# Patient Record
Sex: Female | Born: 1965
Health system: Southern US, Community
[De-identification: ages and names within clinical notes are randomized; demographics above are authoritative.]

## PROBLEM LIST (undated history)

## (undated) DIAGNOSIS — E282 Polycystic ovarian syndrome: Secondary | ICD-10-CM

## (undated) DIAGNOSIS — D509 Iron deficiency anemia, unspecified: Secondary | ICD-10-CM

## (undated) DIAGNOSIS — J45909 Unspecified asthma, uncomplicated: Secondary | ICD-10-CM

## (undated) DIAGNOSIS — D72829 Elevated white blood cell count, unspecified: Secondary | ICD-10-CM

## (undated) DIAGNOSIS — N6320 Unspecified lump in the left breast, unspecified quadrant: Secondary | ICD-10-CM

## (undated) HISTORY — PX: HERNIA REPAIR: SHX51

## (undated) HISTORY — PX: ABDOMINAL HYSTERECTOMY: SHX81

## (undated) HISTORY — DX: Polycystic ovarian syndrome: E28.2

## (undated) HISTORY — DX: Unspecified lump in the left breast, unspecified quadrant: N63.20

## (undated) HISTORY — DX: Elevated white blood cell count, unspecified: D72.829

## (undated) HISTORY — PX: APPENDECTOMY: SHX54

## (undated) HISTORY — PX: CHOLECYSTECTOMY: SHX55

## (undated) HISTORY — PX: BACK SURGERY: SHX140

## (undated) HISTORY — DX: Iron deficiency anemia, unspecified: D50.9

---

## 2011-02-09 ENCOUNTER — Encounter: Payer: Self-pay | Admitting: Family Medicine

## 2011-02-09 LAB — CONVERTED CEMR LAB
ALT: 10 U/L
AST: 12 U/L
Albumin: 4.1 g/dL
Alkaline Phosphatase: 101 U/L
BUN: 8 mg/dL
Basophils Absolute: 0 K/uL
Basophils Relative: 0 %
CO2: 24 meq/L
Calcium: 9.3 mg/dL
Chloride: 104 meq/L
Cholesterol: 222 mg/dL — ABNORMAL HIGH
Creatinine, Ser: 0.6 mg/dL
Eosinophils Absolute: 0.6 K/uL
Eosinophils Relative: 6 % — ABNORMAL HIGH
Glucose, Bld: 93 mg/dL
HCT: 36.7 %
HDL: 39 mg/dL — ABNORMAL LOW
Hemoglobin: 11.4 g/dL — ABNORMAL LOW
LDL Cholesterol: 149 mg/dL — ABNORMAL HIGH
Lymphocytes Relative: 34 %
Lymphs Abs: 3.4 K/uL
MCHC: 31.1 g/dL
MCV: 82.7 fL
Monocytes Absolute: 0.7 K/uL
Monocytes Relative: 7 %
Neutro Abs: 5.4 K/uL
Neutrophils Relative %: 53 %
Platelets: 578 K/uL — ABNORMAL HIGH
Potassium: 4.3 meq/L
RBC: 4.44 M/uL
RDW: 15.1 %
Sodium: 140 meq/L
TSH: 0.87 u[IU]/mL
Total Bilirubin: 0.3 mg/dL
Total CHOL/HDL Ratio: 5.7
Total Protein: 7.1 g/dL
Triglycerides: 169 mg/dL — ABNORMAL HIGH
VLDL: 34 mg/dL
Vit D, 25-Hydroxy: 17 ng/mL — ABNORMAL LOW
Vitamin B-12: >2000 pg/mL — ABNORMAL HIGH
WBC: 10.2 10*3/microliter

## 2011-08-11 ENCOUNTER — Other Ambulatory Visit: Payer: Self-pay | Admitting: Family Medicine

## 2011-08-11 ENCOUNTER — Encounter: Payer: Self-pay | Admitting: Family Medicine

## 2011-08-11 ENCOUNTER — Other Ambulatory Visit: Payer: Self-pay

## 2011-08-11 ENCOUNTER — Ambulatory Visit (HOSPITAL_COMMUNITY)
Admission: RE | Admit: 2011-08-11 | Discharge: 2011-08-11 | Disposition: A | Discharge status: Home / Self Care | Payer: 59 | Source: Ambulatory Visit | Attending: Family Medicine | Admitting: Family Medicine

## 2011-08-11 DIAGNOSIS — J309 Allergic rhinitis, unspecified: Secondary | ICD-10-CM | POA: Insufficient documentation

## 2011-08-11 DIAGNOSIS — R079 Chest pain, unspecified: Secondary | ICD-10-CM | POA: Insufficient documentation

## 2011-08-11 DIAGNOSIS — I499 Cardiac arrhythmia, unspecified: Secondary | ICD-10-CM | POA: Insufficient documentation

## 2011-08-11 DIAGNOSIS — J45909 Unspecified asthma, uncomplicated: Secondary | ICD-10-CM | POA: Insufficient documentation

## 2011-08-11 MED ORDER — DULOXETINE HCL 30 MG PO CPEP: 30.0000 mg | ORAL_CAPSULE | Freq: Every day | ORAL | Status: DC

## 2011-08-11 MED ORDER — PRAVASTATIN SODIUM 20 MG PO TABS: 20.0000 mg | ORAL_TABLET | Freq: Every evening | ORAL | Status: DC

## 2011-08-11 MED ORDER — MONTELUKAST SODIUM 10 MG PO TABS: 10.0000 mg | ORAL_TABLET | Freq: Every day | ORAL | Status: DC

## 2011-08-11 NOTE — Progress Notes (Signed)
  Subjective:    Patient ID: Tammy White, female    DOB: [birth year], 45 y.o.   MRN: [id number]  HPI  CHEST PAIN: several episodes in last few weeks--not related to exertion or emotional stress. 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 Can last minutes to hour. Last night had one that was worse, 7/10 pain, right side of her chest radiating to her right arm and jaw. Lasted more than an hour. No nausea or diaphoresis with it.  Having increased number of palpitations--these not necessarily associated with chest pain.  PERINENT FH / SH: Father died of his initial MI at 12 and her sister had MI at 71 and now has three stents. Nonsmoker. Exercises some, obesity long term issue for her. No alcohol or illicits  PAST SURGICAL HX: Back surgery x 2 Appendectomy Breast reduction' Hysterectomy c section x 2 Cholecystectomy Ventral hernia x 2  Review of Systems Complete ros negative except for pertinents noted in HPI.    Objective:   Physical Exam  Constitutional: She is oriented to person, place, and time.  Neck: Normal range of motion. Neck supple. No thyromegaly present.  Cardiovascular: Normal rate, regular rhythm, normal heart sounds and intact distal pulses.   Pulmonary/Chest: Breath sounds normal.  Abdominal: Soft. Bowel sounds are normal.  Musculoskeletal: She exhibits no edema.  Neurological: She is alert and oriented to person, place, and time.  GEN WD WN NAD  EKG: NSR rate 90, short PR no delta wave identified (PR= 102), QTc 462. No ST changes, no old EKg to compare with       Assessment & Plan:  Chest pain with strong FH CV disease Cards referral Check FLP, CMP and CBC

## 2011-08-19 ENCOUNTER — Other Ambulatory Visit: Payer: Self-pay | Admitting: Family Medicine

## 2011-08-19 LAB — COMPREHENSIVE METABOLIC PANEL
ALT: 14 U/L (ref 0–35)
AST: 17 U/L (ref 0–37)
Albumin: 4.2 g/dL (ref 3.5–5.2)
CO2: 25 mEq/L (ref 19–32)
Calcium: 9.3 mg/dL (ref 8.4–10.5)
Chloride: 102 mEq/L (ref 96–112)
Creat: 0.61 mg/dL (ref 0.50–1.10)
Potassium: 4.3 mEq/L (ref 3.5–5.3)
Sodium: 138 mEq/L (ref 135–145)
Total Protein: 7 g/dL (ref 6.0–8.3)

## 2011-08-19 LAB — TSH: TSH: 1.953 u[IU]/mL (ref 0.350–4.500)

## 2011-08-19 LAB — LIPID PANEL: Total CHOL/HDL Ratio: 4.4 Ratio

## 2011-08-19 LAB — FOLLICLE STIMULATING HORMONE: FSH: 4.4 m[IU]/mL

## 2011-08-20 LAB — CBC WITH DIFFERENTIAL/PLATELET
Basophils Absolute: 0 10*3/uL (ref 0.0–0.1)
Lymphocytes Relative: 37 % (ref 12–46)
Lymphs Abs: 3.8 10*3/uL (ref 0.7–4.0)
Neutro Abs: 5.2 10*3/uL (ref 1.7–7.7)
Platelets: 562 10*3/uL — ABNORMAL HIGH (ref 150–400)
RBC: 4.37 MIL/uL (ref 3.87–5.11)
RDW: 15.3 % (ref 11.5–15.5)
WBC: 10.3 10*3/uL (ref 4.0–10.5)

## 2011-08-25 ENCOUNTER — Encounter: Payer: Self-pay | Admitting: Family Medicine

## 2011-09-20 ENCOUNTER — Ambulatory Visit: Payer: Self-pay | Admitting: Cardiovascular Disease

## 2011-09-20 ENCOUNTER — Ambulatory Visit (INDEPENDENT_AMBULATORY_CARE_PROVIDER_SITE_OTHER): Payer: 59 | Admitting: *Deleted

## 2011-09-20 ENCOUNTER — Encounter: Payer: Self-pay | Admitting: Cardiovascular Disease

## 2011-09-20 DIAGNOSIS — Z111 Encounter for screening for respiratory tuberculosis: Secondary | ICD-10-CM

## 2011-09-20 NOTE — Progress Notes (Signed)
PPD applied right forearm, 0.1 cc intradermal.   [employer] lot # [id number] , exp date 05/29/2013.

## 2011-09-22 ENCOUNTER — Ambulatory Visit (INDEPENDENT_AMBULATORY_CARE_PROVIDER_SITE_OTHER): Payer: 59 | Admitting: *Deleted

## 2011-09-22 DIAGNOSIS — IMO0001 Reserved for inherently not codable concepts without codable children: Secondary | ICD-10-CM

## 2011-09-22 DIAGNOSIS — Z111 Encounter for screening for respiratory tuberculosis: Secondary | ICD-10-CM

## 2011-09-22 NOTE — Progress Notes (Signed)
PPD negative-0 mm. 

## 2011-09-24 ENCOUNTER — Encounter: Payer: Self-pay | Admitting: Cardiovascular Disease

## 2011-12-28 DIAGNOSIS — N6320 Unspecified lump in the left breast, unspecified quadrant: Secondary | ICD-10-CM

## 2011-12-28 HISTORY — DX: Unspecified lump in the left breast, unspecified quadrant: N63.20

## 2012-01-31 ENCOUNTER — Encounter: Payer: Self-pay | Admitting: Family Medicine

## 2012-02-01 ENCOUNTER — Encounter: Payer: Self-pay | Admitting: Family Medicine

## 2012-03-23 ENCOUNTER — Telehealth: Payer: Self-pay | Admitting: Family Medicine

## 2012-03-23 NOTE — Telephone Encounter (Signed)
error 

## 2012-03-29 ENCOUNTER — Ambulatory Visit (INDEPENDENT_AMBULATORY_CARE_PROVIDER_SITE_OTHER): Payer: 59 | Admitting: Family Medicine

## 2012-03-29 DIAGNOSIS — R1032 Left lower quadrant pain: Secondary | ICD-10-CM

## 2012-03-30 ENCOUNTER — Ambulatory Visit
Admission: RE | Admit: 2012-03-30 | Discharge: 2012-03-30 | Disposition: A | Discharge status: Home / Self Care | Payer: 59 | Source: Ambulatory Visit | Attending: Family Medicine | Admitting: Family Medicine

## 2012-03-30 DIAGNOSIS — R1032 Left lower quadrant pain: Secondary | ICD-10-CM

## 2012-03-30 MED ORDER — IOHEXOL 300 MG/ML  SOLN
100.0000 mL | Freq: Once | INTRAMUSCULAR | Status: AC | PRN
  Administered 2012-03-30: 100 mL via INTRAVENOUS

## 2012-03-30 NOTE — Progress Notes (Signed)
Patient ID: Tammy White, female   DOB: September 12, 1966, 46 y.o.   MRN: [id number] Patient complained of abdomen feeling hard to the touch. She said intermittent sharp abdominal pains. History of multiple bowel surgeries. She's worried that she is developing another hernia. On exam she did have a firm lower left quadrant there was without rebound but was tender to palpation. We'll get CT scan.

## 2012-04-13 ENCOUNTER — Other Ambulatory Visit: Payer: Self-pay | Admitting: Family Medicine

## 2012-04-13 MED ORDER — FLUTICASONE PROPIONATE 50 MCG/ACT NA SUSP: 2.0000 | Freq: Every day | NASAL | Status: DC

## 2012-04-14 ENCOUNTER — Other Ambulatory Visit: Payer: Self-pay | Admitting: Family Medicine

## 2012-04-14 MED ORDER — FLUTICASONE PROPIONATE 50 MCG/ACT NA SUSP: 2.0000 | Freq: Every day | NASAL | Status: DC

## 2012-04-19 ENCOUNTER — Other Ambulatory Visit: Payer: Self-pay | Admitting: Family Medicine

## 2012-04-19 MED ORDER — FLUOXETINE HCL 20 MG PO TABS: 20.0000 mg | ORAL_TABLET | Freq: Every day | ORAL | Status: DC

## 2012-04-19 MED ORDER — BUPROPION HCL ER (XL) 150 MG PO TB24: 150.0000 mg | ORAL_TABLET | Freq: Every day | ORAL | Status: DC

## 2012-05-01 ENCOUNTER — Other Ambulatory Visit: Payer: Self-pay | Admitting: Family Medicine

## 2012-05-01 DIAGNOSIS — E871 Hypo-osmolality and hyponatremia: Secondary | ICD-10-CM

## 2012-05-01 LAB — COMPREHENSIVE METABOLIC PANEL
Albumin: 3.8 g/dL (ref 3.5–5.2)
BUN: 11 mg/dL (ref 6–23)
CO2: 25 mEq/L (ref 19–32)
Calcium: 9.5 mg/dL (ref 8.4–10.5)
Chloride: 100 mEq/L (ref 96–112)
Glucose, Bld: 85 mg/dL (ref 70–99)
Potassium: 4.1 mEq/L (ref 3.5–5.3)
Sodium: 139 mEq/L (ref 135–145)
Total Protein: 7.3 g/dL (ref 6.0–8.3)

## 2012-05-01 NOTE — Progress Notes (Signed)
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 As I recently switched her from cymbalta to SSRI and welbutrin, I willorder lab this am.

## 2012-05-18 NOTE — Progress Notes (Signed)
Addended by: Deno Etienne on: 05/18/2012 05:27 PM   Modules accepted: Orders

## 2012-06-20 ENCOUNTER — Ambulatory Visit (HOSPITAL_COMMUNITY)
Admission: RE | Admit: 2012-06-20 | Discharge: 2012-06-20 | Disposition: A | Discharge status: Home / Self Care | Payer: 59 | Source: Ambulatory Visit | Attending: Family Medicine | Admitting: Family Medicine

## 2012-06-20 ENCOUNTER — Other Ambulatory Visit: Payer: Self-pay | Admitting: Family Medicine

## 2012-06-20 DIAGNOSIS — M79644 Pain in right finger(s): Secondary | ICD-10-CM | POA: Insufficient documentation

## 2012-06-20 DIAGNOSIS — M899 Disorder of bone, unspecified: Secondary | ICD-10-CM | POA: Insufficient documentation

## 2012-06-20 DIAGNOSIS — M79609 Pain in unspecified limb: Secondary | ICD-10-CM | POA: Insufficient documentation

## 2012-06-20 NOTE — Assessment & Plan Note (Signed)
Skin lesion on right middle finger for 3 months.  Has become exquisitely painful.  Will check x ray to R/O osteo.

## 2012-09-27 ENCOUNTER — Ambulatory Visit (INDEPENDENT_AMBULATORY_CARE_PROVIDER_SITE_OTHER): Payer: 59 | Admitting: *Deleted

## 2012-09-27 DIAGNOSIS — Z23 Encounter for immunization: Secondary | ICD-10-CM

## 2012-10-09 ENCOUNTER — Other Ambulatory Visit: Payer: Self-pay | Admitting: Family Medicine

## 2012-10-09 MED ORDER — PREDNISONE 10 MG PO TABS: ORAL_TABLET | ORAL | Status: DC

## 2012-10-20 ENCOUNTER — Ambulatory Visit (INDEPENDENT_AMBULATORY_CARE_PROVIDER_SITE_OTHER): Payer: Managed Care, Other (non HMO) | Admitting: Family Medicine

## 2012-10-20 DIAGNOSIS — J45901 Unspecified asthma with (acute) exacerbation: Secondary | ICD-10-CM

## 2012-10-20 MED ORDER — METHYLPREDNISOLONE SODIUM SUCC 125 MG IJ SOLR
125.0000 mg | Freq: Once | INTRAMUSCULAR | Status: AC
  Administered 2012-10-20: 125 mg via INTRAMUSCULAR

## 2012-10-20 MED ORDER — ALBUTEROL SULFATE (2.5 MG/3ML) 0.083% IN NEBU
2.5000 mg | INHALATION_SOLUTION | Freq: Once | RESPIRATORY_TRACT | Status: AC
  Administered 2012-10-20: 2.5 mg via RESPIRATORY_TRACT

## 2012-10-20 MED ORDER — IPRATROPIUM BROMIDE 0.02 % IN SOLN
0.5000 mg | Freq: Once | RESPIRATORY_TRACT | Status: AC
  Administered 2012-10-20: 0.5 mg via RESPIRATORY_TRACT

## 2012-10-20 NOTE — Assessment & Plan Note (Addendum)
Asthma exacerbation.  Duoneb given x1 with good response, good air movement and decreased wheezes after treatment.  Given dose of IM solumedrol as well.  No indications of abx at this time.  Instructed to f/u with pcp if continues to worsen.

## 2012-10-26 NOTE — Progress Notes (Signed)
  Subjective:    Patient ID: Tammy White, female    DOB: [birth year], 46 y.o.   MRN: [id number]  HPI  1. Asthma:  Complaint of worsening asthma over the past couple of days.  Has symptoms of URI including congestion, cough.  She has recently completed course of steroids rx'ed by PCP with initial improvement but now worsened.  She does endorse wheezing with shortness of breath, and has been using her albuterol fairly frequently.  Denies fever, chills, chest pain.  Review of Systems Per HPI    Objective:   Physical Exam  Constitutional: No distress.  HENT:  Head: Normocephalic and atraumatic.  Cardiovascular: Normal rate and regular rhythm.   Pulmonary/Chest:       Decreased air movement throughout all lung fields with some mild expiratory wheezing.            Assessment & Plan:

## 2012-12-25 ENCOUNTER — Other Ambulatory Visit: Payer: Self-pay | Admitting: Family Medicine

## 2012-12-25 MED ORDER — PRAVASTATIN SODIUM 20 MG PO TABS: 20.0000 mg | ORAL_TABLET | Freq: Every day | ORAL | Status: DC

## 2013-02-02 ENCOUNTER — Other Ambulatory Visit: Payer: Self-pay | Admitting: *Deleted

## 2013-02-02 DIAGNOSIS — F419 Anxiety disorder, unspecified: Secondary | ICD-10-CM

## 2013-02-02 MED ORDER — CITALOPRAM HYDROBROMIDE 20 MG PO TABS: 20.0000 mg | ORAL_TABLET | Freq: Every day | ORAL | Status: DC

## 2013-04-06 ENCOUNTER — Other Ambulatory Visit: Payer: Self-pay | Admitting: *Deleted

## 2013-04-06 DIAGNOSIS — J45909 Unspecified asthma, uncomplicated: Secondary | ICD-10-CM

## 2013-04-06 MED ORDER — DEXAMETHASONE SOD PHOSPHATE PF 10 MG/ML IJ SOLN
10.0000 mg | Freq: Once | INTRAMUSCULAR | Status: AC
  Administered 2013-04-06: 10 mg via INTRAMUSCULAR

## 2013-05-03 ENCOUNTER — Other Ambulatory Visit: Payer: Self-pay | Admitting: Family Medicine

## 2013-05-03 ENCOUNTER — Ambulatory Visit (INDEPENDENT_AMBULATORY_CARE_PROVIDER_SITE_OTHER): Payer: 59 | Admitting: Family Medicine

## 2013-05-03 VITALS — BP 139/76 | HR 90 | Temp 97.9°F | Ht 61.0 in | Wt 175.0 lb

## 2013-05-03 DIAGNOSIS — D6859 Other primary thrombophilia: Secondary | ICD-10-CM | POA: Insufficient documentation

## 2013-05-03 DIAGNOSIS — J45909 Unspecified asthma, uncomplicated: Secondary | ICD-10-CM

## 2013-05-03 DIAGNOSIS — Z0000 Encounter for general adult medical examination without abnormal findings: Secondary | ICD-10-CM

## 2013-05-03 DIAGNOSIS — E785 Hyperlipidemia, unspecified: Secondary | ICD-10-CM | POA: Insufficient documentation

## 2013-05-03 DIAGNOSIS — Z01419 Encounter for gynecological examination (general) (routine) without abnormal findings: Secondary | ICD-10-CM | POA: Insufficient documentation

## 2013-05-03 MED ORDER — CITALOPRAM HYDROBROMIDE 40 MG PO TABS: 40.0000 mg | ORAL_TABLET | Freq: Every day | ORAL | Status: DC

## 2013-05-03 MED ORDER — ATORVASTATIN CALCIUM 20 MG PO TABS: 20.0000 mg | ORAL_TABLET | Freq: Every day | ORAL | Status: AC

## 2013-05-03 NOTE — Progress Notes (Signed)
  Subjective:    Patient ID: Tammy White, female    DOB: Oct 02, 1966, 47 y.o.   MRN: [id number]  HPI  1. Asthma: using her albuterol "like candy". Feels tight in chest most days. Does have hx of allergy esp to grass being a problem.  2. Concerned about weight: walking for exercise several days a week and trying to eat well. Highest weight in her life was 210. Has had breast reduction surgery in past.  3. Mood---continues on celexa which she has been on for several years---does not feel like her low grade depression ever really lifts but is functional,no SI/HI.   4. Would like to get some basic labs and recheckplateltes which have been high for several years.  Review of Systems  Constitutional: Negative for fever and unexpected weight change.  HENT: Positive for rhinorrhea, sneezing and postnasal drip.   Eyes: Positive for itching. Negative for discharge, redness and visual disturbance.  Respiratory: Positive for chest tightness, shortness of breath and wheezing.   Cardiovascular: Negative for palpitations.  Gastrointestinal: Negative for abdominal pain, diarrhea and constipation.  Genitourinary: Negative for frequency and difficulty urinating.  Skin: Negative for rash.  Neurological: Positive for headaches. Negative for speech difficulty.  Hematological: Does not bruise/bleed easily.  Psychiatric/Behavioral: Positive for dysphoric mood. Negative for suicidal ideas, hallucinations, behavioral problems, confusion, sleep disturbance, self-injury and agitation. The patient is not nervous/anxious.        Objective:   Physical Exam  Constitutional: She is oriented to person, place, and time. She appears well-developed.  HENT:  Head: Normocephalic and atraumatic.  Mouth/Throat: Oropharynx is clear and moist.  Eyes: Conjunctivae and EOM are normal. Pupils are equal, round, and reactive to light.  Neck: Normal range of motion. Neck supple.  Cardiovascular: Normal rate, regular rhythm and  normal heart sounds.   No murmur heard. Pulmonary/Chest: Effort normal. She has wheezes. She has no rales.  Abdominal: Soft. Bowel sounds are normal.  Musculoskeletal: Normal range of motion.  Neurological: She is alert and oriented to person, place, and time. She has normal reflexes.  Skin: No rash noted.  Psychiatric: She has a normal mood and affect. Her behavior is normal. Judgment and thought content normal.          Assessment & Plan:

## 2013-05-03 NOTE — Assessment & Plan Note (Signed)
Reviewed her Fh which is positive for early ASCVD. Given new guidelines I think she owuld be better served by higher dose statin such as lipitor 20. Will see if she tolerates. Baseline lipid panel today on pravachol

## 2013-05-03 NOTE — Assessment & Plan Note (Signed)
Recheck cbc today.   

## 2013-05-03 NOTE — Assessment & Plan Note (Signed)
Given her hx and her current symptoms, I think she needs to see pulmonology, and perhaps at some point allergist. Has previously been on allergy shots but one of her intubations was after a shot (she thinks). Will refer to pulm and then see if we want her to go to allergy as well.

## 2013-05-04 LAB — CBC WITH DIFFERENTIAL/PLATELET
Basophils Relative: 0 % (ref 0–1)
Eosinophils Absolute: 0.5 10*3/uL (ref 0.0–0.7)
Hemoglobin: 11.3 g/dL — ABNORMAL LOW (ref 12.0–15.0)
Lymphs Abs: 3.6 10*3/uL (ref 0.7–4.0)
MCH: 24.6 pg — ABNORMAL LOW (ref 26.0–34.0)
Monocytes Relative: 8 % (ref 3–12)
Neutro Abs: 6 10*3/uL (ref 1.7–7.7)
Neutrophils Relative %: 55 % (ref 43–77)
Platelets: 616 10*3/uL — ABNORMAL HIGH (ref 150–400)
RBC: 4.6 MIL/uL (ref 3.87–5.11)
WBC: 11 10*3/uL — ABNORMAL HIGH (ref 4.0–10.5)

## 2013-05-04 LAB — COMPREHENSIVE METABOLIC PANEL
ALT: 17 U/L (ref 0–35)
Albumin: 4 g/dL (ref 3.5–5.2)
Alkaline Phosphatase: 99 U/L (ref 39–117)
CO2: 26 mEq/L (ref 19–32)
Glucose, Bld: 108 mg/dL — ABNORMAL HIGH (ref 70–99)
Potassium: 4 mEq/L (ref 3.5–5.3)
Sodium: 136 mEq/L (ref 135–145)
Total Bilirubin: 0.3 mg/dL (ref 0.3–1.2)
Total Protein: 7 g/dL (ref 6.0–8.3)

## 2013-05-04 LAB — LIPID PANEL
LDL Cholesterol: 125 mg/dL — ABNORMAL HIGH (ref 0–99)
VLDL: 37 mg/dL (ref 0–40)

## 2013-05-10 ENCOUNTER — Other Ambulatory Visit: Payer: Self-pay | Admitting: Family Medicine

## 2013-05-10 ENCOUNTER — Encounter: Payer: Self-pay | Admitting: Family Medicine

## 2013-05-10 DIAGNOSIS — D6859 Other primary thrombophilia: Secondary | ICD-10-CM

## 2013-05-15 ENCOUNTER — Encounter: Payer: Self-pay | Admitting: *Deleted

## 2013-05-30 ENCOUNTER — Institutional Professional Consult (permissible substitution): Payer: Self-pay | Admitting: Pulmonary Disease

## 2013-06-05 ENCOUNTER — Institutional Professional Consult (permissible substitution): Payer: Self-pay | Admitting: Pulmonary Disease

## 2013-06-06 ENCOUNTER — Telehealth: Payer: Self-pay | Admitting: *Deleted

## 2013-06-06 MED ORDER — EPINEPHRINE 0.3 MG/0.3ML IJ SOAJ: 0.3000 mg | Freq: Once | INTRAMUSCULAR | Status: DC

## 2013-06-06 NOTE — Telephone Encounter (Signed)
Epi pen requested

## 2013-06-21 IMAGING — CR DG FINGER MIDDLE 2+V*R*
3 series · 3 of 3 positions shown · non-contrast
Comparison: None.

CLINICAL DATA: Finger pain.

RIGHT MIDDLE FINGER 2+V

[x finger pa right]
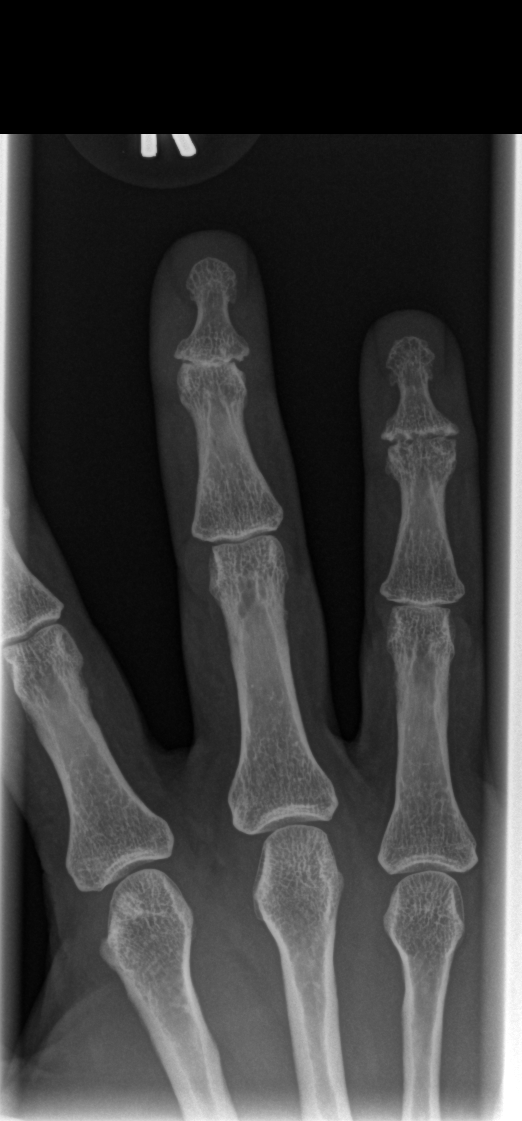

[x finger obl. right]
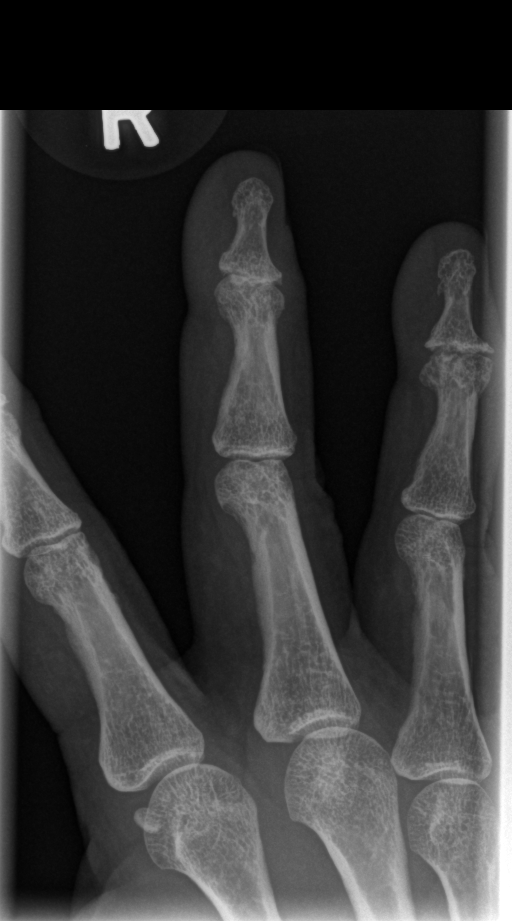

[x finger lateral right]
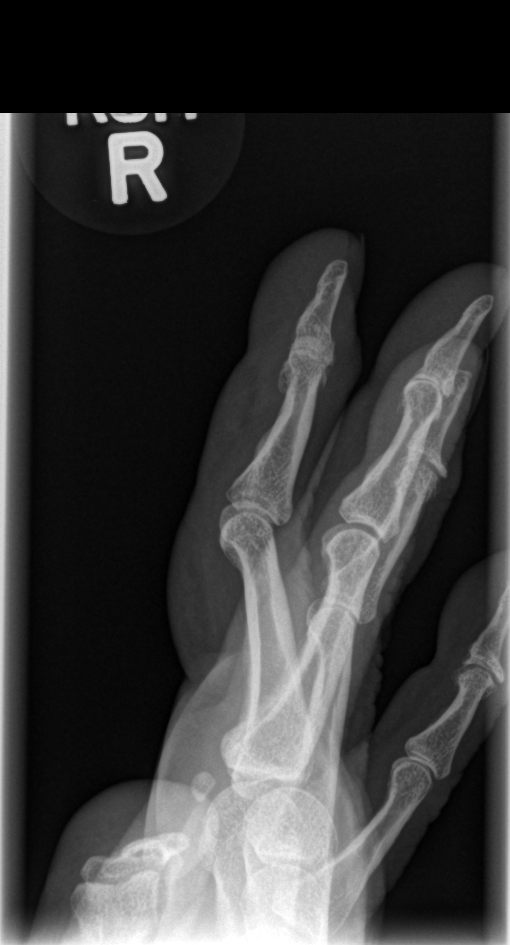

[3 of 3 positions shown; findings below may reference images not displayed]

FINDINGS: Joint space narrowing with mild spurring and  endplate
irregularity involving the third DIP.  Similar   changes in the
adjacent fourth DIP.  Mineralization is normal.  No inflammatory
type erosion.  The PIP joints are normal.
IMPRESSION: Cartilage loss and mild spurring compatible with osteoarthritis
DIP.

## 2013-07-12 ENCOUNTER — Other Ambulatory Visit: Payer: Self-pay | Admitting: *Deleted

## 2013-07-12 MED ORDER — MONTELUKAST SODIUM 10 MG PO TABS: 10.0000 mg | ORAL_TABLET | Freq: Every day | ORAL | Status: DC

## 2013-07-12 MED ORDER — FLUTICASONE-SALMETEROL 500-50 MCG/DOSE IN AEPB: 1.0000 | INHALATION_SPRAY | Freq: Two times a day (BID) | RESPIRATORY_TRACT | Status: DC

## 2013-08-24 ENCOUNTER — Other Ambulatory Visit: Payer: Self-pay | Admitting: Family Medicine

## 2013-08-24 DIAGNOSIS — R51 Headache: Secondary | ICD-10-CM

## 2013-08-24 DIAGNOSIS — R232 Flushing: Secondary | ICD-10-CM

## 2013-08-24 DIAGNOSIS — R5381 Other malaise: Secondary | ICD-10-CM

## 2013-08-24 LAB — CBC WITH DIFFERENTIAL/PLATELET
Hemoglobin: 10.8 g/dL — ABNORMAL LOW (ref 12.0–15.0)
Lymphocytes Relative: 35 % (ref 12–46)
Lymphs Abs: 4.2 10*3/uL — ABNORMAL HIGH (ref 0.7–4.0)
Monocytes Relative: 8 % (ref 3–12)
Neutro Abs: 6.3 10*3/uL (ref 1.7–7.7)
Neutrophils Relative %: 53 % (ref 43–77)
Platelets: 602 10*3/uL — ABNORMAL HIGH (ref 150–400)
RBC: 4.2 MIL/uL (ref 3.87–5.11)
WBC: 11.9 10*3/uL — ABNORMAL HIGH (ref 4.0–10.5)

## 2013-08-24 NOTE — Progress Notes (Signed)
Pt c/o headaches 91-2 week) fatigue, hot flashes. Joint pains in knees and feet. Wants labs.

## 2013-08-25 LAB — COMPREHENSIVE METABOLIC PANEL
ALT: 20 U/L (ref 0–35)
CO2: 28 mEq/L (ref 19–32)
Chloride: 101 mEq/L (ref 96–112)
Potassium: 4.1 mEq/L (ref 3.5–5.3)
Sodium: 137 mEq/L (ref 135–145)
Total Bilirubin: 0.1 mg/dL — ABNORMAL LOW (ref 0.3–1.2)

## 2013-08-25 LAB — FOLLICLE STIMULATING HORMONE: FSH: 5.8 m[IU]/mL

## 2013-08-29 ENCOUNTER — Other Ambulatory Visit: Payer: Self-pay | Admitting: Family Medicine

## 2013-08-29 DIAGNOSIS — D75839 Thrombocytosis, unspecified: Secondary | ICD-10-CM

## 2013-08-29 DIAGNOSIS — D473 Essential (hemorrhagic) thrombocythemia: Secondary | ICD-10-CM | POA: Insufficient documentation

## 2013-08-30 ENCOUNTER — Telehealth: Payer: Self-pay | Admitting: Oncology

## 2013-08-30 NOTE — Telephone Encounter (Signed)
C/D 08/30/13 for appt. 09/19/13 °

## 2013-08-30 NOTE — Telephone Encounter (Signed)
S/W PT IN RE TO NP APPT 09/24 @ 3 W/DR. GRANFORTUNA REFERRING DR. SARA NEAL DX- THROMBOCYTOSIS WELCOME PACKET MAILED.

## 2013-09-09 ENCOUNTER — Encounter: Payer: Self-pay | Admitting: Oncology

## 2013-09-09 ENCOUNTER — Other Ambulatory Visit: Payer: Self-pay | Admitting: Oncology

## 2013-09-09 DIAGNOSIS — D509 Iron deficiency anemia, unspecified: Secondary | ICD-10-CM

## 2013-09-09 DIAGNOSIS — D473 Essential (hemorrhagic) thrombocythemia: Secondary | ICD-10-CM

## 2013-09-09 HISTORY — DX: Iron deficiency anemia, unspecified: D50.9

## 2013-09-10 ENCOUNTER — Other Ambulatory Visit: Payer: Self-pay | Admitting: *Deleted

## 2013-09-10 DIAGNOSIS — F411 Generalized anxiety disorder: Secondary | ICD-10-CM

## 2013-09-10 MED ORDER — CITALOPRAM HYDROBROMIDE 40 MG PO TABS: 40.0000 mg | ORAL_TABLET | Freq: Every day | ORAL | Status: DC

## 2013-09-11 ENCOUNTER — Telehealth: Payer: Self-pay | Admitting: Oncology

## 2013-09-11 NOTE — Telephone Encounter (Signed)
Message to HIM re pof sent 9/14 for new pt lbs.

## 2013-09-19 ENCOUNTER — Ambulatory Visit: Payer: 59

## 2013-09-19 ENCOUNTER — Encounter: Payer: 59 | Admitting: Oncology

## 2013-09-30 ENCOUNTER — Other Ambulatory Visit: Payer: Self-pay | Admitting: Oncology

## 2013-10-01 ENCOUNTER — Telehealth: Payer: Self-pay | Admitting: Oncology

## 2013-10-10 ENCOUNTER — Other Ambulatory Visit: Payer: Self-pay | Admitting: Oncology

## 2013-10-10 ENCOUNTER — Ambulatory Visit (HOSPITAL_BASED_OUTPATIENT_CLINIC_OR_DEPARTMENT_OTHER): Payer: 59

## 2013-10-10 ENCOUNTER — Encounter: Payer: Self-pay | Admitting: Oncology

## 2013-10-10 ENCOUNTER — Ambulatory Visit: Payer: 59

## 2013-10-10 ENCOUNTER — Ambulatory Visit (HOSPITAL_BASED_OUTPATIENT_CLINIC_OR_DEPARTMENT_OTHER): Payer: 59 | Admitting: Oncology

## 2013-10-10 ENCOUNTER — Telehealth: Payer: Self-pay | Admitting: Oncology

## 2013-10-10 VITALS — BP 135/76 | HR 86 | Temp 97.2°F | Resp 20 | Ht 61.0 in | Wt 201.8 lb

## 2013-10-10 DIAGNOSIS — D473 Essential (hemorrhagic) thrombocythemia: Secondary | ICD-10-CM

## 2013-10-10 DIAGNOSIS — E282 Polycystic ovarian syndrome: Secondary | ICD-10-CM | POA: Insufficient documentation

## 2013-10-10 DIAGNOSIS — D509 Iron deficiency anemia, unspecified: Secondary | ICD-10-CM

## 2013-10-10 DIAGNOSIS — G609 Hereditary and idiopathic neuropathy, unspecified: Secondary | ICD-10-CM

## 2013-10-10 DIAGNOSIS — D72829 Elevated white blood cell count, unspecified: Secondary | ICD-10-CM

## 2013-10-10 HISTORY — DX: Elevated white blood cell count, unspecified: D72.829

## 2013-10-10 HISTORY — DX: Polycystic ovarian syndrome: E28.2

## 2013-10-10 LAB — CBC & DIFF AND RETIC
Basophils Absolute: 0 10*3/uL (ref 0.0–0.1)
Eosinophils Absolute: 0.5 10*3/uL (ref 0.0–0.5)
HGB: 10.5 g/dL — ABNORMAL LOW (ref 11.6–15.9)
Immature Retic Fract: 14.8 % — ABNORMAL HIGH (ref 1.60–10.00)
MCHC: 31.7 g/dL (ref 31.5–36.0)
MCV: 78.1 fL — ABNORMAL LOW (ref 79.5–101.0)
MONO#: 1 10*3/uL — ABNORMAL HIGH (ref 0.1–0.9)
MONO%: 7.4 % (ref 0.0–14.0)
NEUT#: 7.8 10*3/uL — ABNORMAL HIGH (ref 1.5–6.5)
NEUT%: 58.6 % (ref 38.4–76.8)
Platelets: 549 10*3/uL — ABNORMAL HIGH (ref 145–400)
RDW: 15.1 % — ABNORMAL HIGH (ref 11.2–14.5)
Retic %: 1.46 % (ref 0.70–2.10)
Retic Ct Abs: 61.9 10*3/uL (ref 33.70–90.70)
WBC: 13.3 10*3/uL — ABNORMAL HIGH (ref 3.9–10.3)
lymph#: 4 10*3/uL — ABNORMAL HIGH (ref 0.9–3.3)

## 2013-10-10 LAB — MORPHOLOGY: PLT EST: INCREASED

## 2013-10-10 LAB — CHCC SMEAR

## 2013-10-10 LAB — SEDIMENTATION RATE: Sed Rate: 45 mm/hr — ABNORMAL HIGH (ref 0–22)

## 2013-10-10 MED ORDER — POLYSACCHARIDE IRON COMPLEX 150 MG PO CAPS: 150.0000 mg | ORAL_CAPSULE | Freq: Two times a day (BID) | ORAL | Status: DC

## 2013-10-10 NOTE — Telephone Encounter (Signed)
Called pt and left message regarding lab and MD appt on December 2014

## 2013-10-10 NOTE — Progress Notes (Signed)
New Patient Hematology-Oncology Evaluation   Tammy White [id number] 05/16/1966 47 y.o. 10/10/2013  CC: Dr. 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 She tells me that she was evaluated 7 years ago by Dr. Christella Scheuermann, a hematologist in [facility] and had a bone marrow biopsy and was told that she had essential thrombocythemia. She was put on one aspirin a day. She does not remember having any blood work done through his office and specifically does not recall having a JAK-2 gene analysis study done. Further questioning reveals that she may have other reasons for the high platelet count: She has had rather severe, intrinsic asthma all of her life. She takes intermittent courses of steroids. She has polycystic ovary syndrome which causes chronic inflammation and can cause elevation of both the white count and platelets  presumed to be on the basis of chronic cytokine release. She has a microcytic anemia and is likely iron deficient which will also cause a nonspecific elevation of her platelet count.  She has never had any thrombotic events. She does get headaches almost daily but does not think that these are migraines. She has chronic paresthesias of her fingers and toes. She is not diabetic. She has no signs or symptoms of a collagen vascular disorder. There is a strong family history of cardiac disease but nobody with any blood disorders.   PMH: Past Medical History  Diagnosis Date  . Mass of breast, left 12/2011    fibroglandular tissue  mammo ?[place]   . Microcytic anemia 09/09/2013   GERD on Nexium. Intrinsic asthma-chronic. No history of hypertension, ulcers, diabetes, thyroid disease, hepatitis, yellow jaundice, mononucleosis, seizure, or blood clots. 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  Previous surgery: C-sections  with both of her children and a BTL; appendectomy, cholecystectomy, ventral hernia repair, breast reduction, abdominoplasty, TAH/USO 6 years ago. In  Allergies: Allergies  Allergen Reactions  . Asa [Aspirin] Shortness Of Breath  . Latex Shortness Of Breath  . Terramycin [Oxytetracycline] Shortness Of Breath  . Penicillins     sob  . Promethazine Hcl     sob  . Sulfa Antibiotics     sob    Medications: Albuterol 2 puffs every 6 hours when necessary, Advair Diskus 500-50 one puff twice a day, Singulair 10 mg at bedtime, EpiPen 0.3 mg when necessary; Lipitor 20 mg daily, aspirin 81 mg daily, Celexa 40 mg daily,   Social History:  She is married. Works as a Engineer, site. 2 children a girl 98 and a son 42 both healthy. Nonsmoker. She does enjoy wine. No exposure to organic chemicals or therapeutic radiation  Family History: Father died at age 62 of a heart attack. Sr. had a heart attack at age 29 now 51. Brother age 58 healthy.  Review of Systems: Constitutional symptoms: No anorexia, weight loss, or fever HEENT: No sore throat. She recently saw an eye doctor and was told she had scleritis in her left eye Respiratory: Frequent asthma attacks. Cardiovascular:  No chest pain or palpitations Gastrointestinal ROS: Chronic loose bowel movements 4-5 per day Genito-Urinary ROS: No urinary tract symptoms. No vaginal bleeding Musculoskeletal: No muscle or bone pain. Neurologic: See history of present illness Dermatologic: No rash or ecchymosis Remaining ROS negative.  Physical Exam: Blood pressure 135/76, pulse 86, temperature 97.2 F (36.2 C), temperature source Oral, resp. rate 20, height  5\' 1"  (1.549 m), weight 201 lb 12.8 oz (91.536 kg). Wt Readings from Last 3 Encounters:  10/10/13 201 lb 12.8 oz (91.536 kg)  05/03/13 175 lb (79.379 kg)    General appearance: Pleasant Caucasian woman NAD HENNT: Pharynx no erythema or exudate. No thyromegaly. Healing scleritis left  eye Lymph nodes: No cervical, supraclavicular, or axillary adenopathy Breasts: Lungs: Clear to auscultation resonant to percussion Heart: Regular rhythm no murmur Vascular: Carotids 2+ no bruits Abdominal: Soft, nontender, multiple surgical scars, no mass, no organomegaly GU: Extremities: No edema, no calf tenderness Neurologic: Mental status intact, PERRLA, optic disc sharp and vessels normal, motor strength 5 over 5, reflexes 2+ symmetric, severe decrease in vibration sensation over the fingertips by tuning fork exam Skin: No rash or ecchymosis    Lab Results: Lab Results  Component Value Date   WBC 13.3* 10/10/2013   HGB 10.5* 10/10/2013   HCT 33.1* 10/10/2013   MCV 78.1* 10/10/2013   PLT 549* 10/10/2013     Chemistry      Component Value Date/Time   NA 137 08/24/2013 1605   K 4.1 08/24/2013 1605   CL 101 08/24/2013 1605   CO2 28 08/24/2013 1605   BUN 8 08/24/2013 1605   CREATININE 0.66 08/24/2013 1605   CREATININE 0.60 02/09/2011 2255      Component Value Date/Time   CALCIUM 9.0 08/24/2013 1605   ALKPHOS 100 08/24/2013 1605   AST 18 08/24/2013 1605   ALT 20 08/24/2013 1605   BILITOT 0.1* 08/24/2013 1605       Review of peripheral blood film: Normochromic normocytic red cells. Mature neutrophils and lymphocytes. Platelets appear normal in morphology. Occasional large platelet and a single dwarf megakaryocyte seen    Impression and Plan: #1. Chronically elevated platelet count and mild chronic leukocytosis. It is not clear at this time whether or not she really does have a myeloproliferative disorder. She has multiple reasons to have an elevated platelet count: Iron deficiency anemia, chronic inflammation associated with polycystic ovary syndrome, and chronic inflammation related to intrinsic asthma. If she does in fact have a myeloproliferative disorder, treatment of choice is low-dose aspirin which she is already on. There are no indications for platelet lowering drugs in  young people with platelet counts less than 1 million who have never had a thrombotic event. Plan: I will obtain aJAK-2 gene analysis which is mutated in about 50% of people with essential thrombocythemia. This will be helpful if positive but not if negative. I'm starting her on Niferex 150 mg twice a day iron to assess the effect of the iron replacement on her platelet count.  #2. Polycystic ovary syndrome by history See discussion above history of present illness  #3. Signs and symptoms of a peripheral neuropathy for unclear reasons. This may warrant further evaluation by neurology.  #4. Iron deficiency anemia in a woman who is 6 years status post hysterectomy. She has chronic loose bowel movements but no hematochezia or melena. I will see her back again in 2 months. It takes at least that long to see the results of oral iron replacement on the hemoglobin. I think if she normalizes then we don't need to be concerned but I would have a low threshold for referring her for a colonoscopy if anemia persists.     Levert Feinstein, MD 10/10/2013, 6:28 PM

## 2013-10-11 LAB — IRON AND TIBC CHCC
%SAT: 5 % — ABNORMAL LOW (ref 21–57)
TIBC: 445 ug/dL — ABNORMAL HIGH (ref 236–444)
UIBC: 421 ug/dL — ABNORMAL HIGH (ref 120–384)

## 2013-10-24 ENCOUNTER — Encounter: Payer: Self-pay | Admitting: Family Medicine

## 2013-11-01 ENCOUNTER — Other Ambulatory Visit: Payer: Self-pay

## 2013-11-02 ENCOUNTER — Telehealth: Payer: Self-pay | Admitting: Oncology

## 2013-11-02 NOTE — Telephone Encounter (Signed)
Pt called and r/s lab and MD to November 2014

## 2013-11-09 ENCOUNTER — Other Ambulatory Visit: Payer: Self-pay | Admitting: *Deleted

## 2013-11-09 MED ORDER — MONTELUKAST SODIUM 10 MG PO TABS: 10.0000 mg | ORAL_TABLET | Freq: Every day | ORAL | Status: DC

## 2013-11-23 ENCOUNTER — Telehealth: Payer: Self-pay | Admitting: Oncology

## 2013-11-23 ENCOUNTER — Other Ambulatory Visit (HOSPITAL_BASED_OUTPATIENT_CLINIC_OR_DEPARTMENT_OTHER): Payer: 59 | Admitting: Lab

## 2013-11-23 ENCOUNTER — Ambulatory Visit (HOSPITAL_BASED_OUTPATIENT_CLINIC_OR_DEPARTMENT_OTHER): Payer: 59 | Admitting: Oncology

## 2013-11-23 VITALS — BP 141/77 | HR 80 | Temp 96.8°F | Resp 18 | Ht 61.0 in | Wt 200.4 lb

## 2013-11-23 DIAGNOSIS — D509 Iron deficiency anemia, unspecified: Secondary | ICD-10-CM

## 2013-11-23 DIAGNOSIS — D75839 Thrombocytosis, unspecified: Secondary | ICD-10-CM

## 2013-11-23 DIAGNOSIS — D473 Essential (hemorrhagic) thrombocythemia: Secondary | ICD-10-CM

## 2013-11-23 DIAGNOSIS — M79643 Pain in unspecified hand: Secondary | ICD-10-CM

## 2013-11-23 DIAGNOSIS — D72829 Elevated white blood cell count, unspecified: Secondary | ICD-10-CM

## 2013-11-23 LAB — CBC WITH DIFFERENTIAL/PLATELET
BASO%: 0.4 % (ref 0.0–2.0)
Basophils Absolute: 0.1 10*3/uL (ref 0.0–0.1)
EOS%: 5 % (ref 0.0–7.0)
Eosinophils Absolute: 0.6 10*3/uL — ABNORMAL HIGH (ref 0.0–0.5)
HCT: 35.5 % (ref 34.8–46.6)
HGB: 11.1 g/dL — ABNORMAL LOW (ref 11.6–15.9)
MCH: 24.7 pg — ABNORMAL LOW (ref 25.1–34.0)
MONO#: 0.8 10*3/uL (ref 0.1–0.9)
NEUT#: 7.3 10*3/uL — ABNORMAL HIGH (ref 1.5–6.5)
NEUT%: 56.6 % (ref 38.4–76.8)
RBC: 4.5 10*6/uL (ref 3.70–5.45)
WBC: 12.9 10*3/uL — ABNORMAL HIGH (ref 3.9–10.3)
lymph#: 4.2 10*3/uL — ABNORMAL HIGH (ref 0.9–3.3)

## 2013-11-23 LAB — FERRITIN CHCC: Ferritin: 19 ng/ml (ref 9–269)

## 2013-11-23 NOTE — Progress Notes (Signed)
Short interval followup visit for this pleasant 47 year old woman I evaluated here on 10/10/2013 for unexplained leukocytosis and thrombocytosis. Please see office consult note for full details. She was evaluated in the past for the same problem by a hematologist in [facility]. Evaluation included a nondiagnostic bone marrow. She was told she had a myeloproliferative disorder and put on aspirin.  I don't think that she actually has a myeloproliferative disorder. Signs and symptoms point towards a chronic inflammatory process. I obtained a JAK-2 gene mutation analysis and this returned normal. Approximately 85% of people with polycythemia vera and 50% of people with essential thrombocythemia have a mutation in this gene. A CT scan of the abdomen and pelvis done for abdominal pain in April of 2013 showed no splenomegaly and no lymphadenopathy.  She does have a history of polycystic ovary syndrome. Although she had a hysterectomy, only one ovary was removed and this could be a source for chronic inflammation and has been definitely linked to chronic leukocytosis and thrombocytosis. ESR done at time of her visit here last month was 45 mm. She tells me today that she's been having increasing pain in the small joints of her hands. I reexamined her and is today. She is very prominent Heberden's nodes which would be more a sign of degenerative than inflammatory arthritis.  Iron studies showed that she is iron depleted with serum iron of 24, percent saturation 5, TIBC 445, and ferritin of 13. I started her on oral iron. She has had some change in her normal bowel habit recently but but denied any hematochezia or melena. However I would still be concerned with the possibility of an underlying malignancy of the GI tract. She is a nonsmoker.  Impression: Chronic leukocytosis and thrombocytosis likely secondary to underlying inflammatory reaction. I cannot rule out occult malignancy especially in view of the iron  deficiency in a non-menstruating female. In view of her joint complaints I am going to get an ANA and rheumatoid factor screening test. Am advising that she gets a colonoscopy to rule out occult malignancy. I will make a referral to gastroenterology. I'll see her again at the end of January to make sure that all of the above has been done.

## 2013-11-23 NOTE — Telephone Encounter (Signed)
mailed pt avs and letter

## 2013-11-26 LAB — RHEUMATOID FACTOR: Rhuematoid fact SerPl-aCnc: <10 IU/mL (ref <=14)

## 2013-11-26 LAB — ANA: Anti Nuclear Antibody(ANA): NEGATIVE

## 2013-12-14 ENCOUNTER — Other Ambulatory Visit: Payer: 59 | Admitting: Lab

## 2013-12-14 ENCOUNTER — Ambulatory Visit: Payer: 59 | Admitting: Oncology

## 2013-12-26 ENCOUNTER — Telehealth: Payer: Self-pay | Admitting: *Deleted

## 2013-12-26 NOTE — Telephone Encounter (Signed)
Spoke with patient.  Let her know screening lab tests for lupus and RA returned negative.  She appreciated the call.

## 2013-12-26 NOTE — Telephone Encounter (Signed)
Message copied by Orbie Hurst on Wed Dec 26, 2013  9:56 AM ------      Message from: Levert Feinstein      Created: Fri Dec 21, 2013  9:41 AM       Call pt: screening lab tests for lupus and rheumatoid arthritis returned negative ------

## 2014-01-07 ENCOUNTER — Other Ambulatory Visit: Payer: Self-pay | Admitting: Family Medicine

## 2014-01-07 MED ORDER — PREDNISONE 10 MG PO TABS: ORAL_TABLET | ORAL | Status: DC

## 2014-01-15 ENCOUNTER — Encounter (HOSPITAL_BASED_OUTPATIENT_CLINIC_OR_DEPARTMENT_OTHER): Payer: Self-pay | Admitting: Emergency Medicine

## 2014-01-15 ENCOUNTER — Emergency Department (HOSPITAL_BASED_OUTPATIENT_CLINIC_OR_DEPARTMENT_OTHER)
Admission: EM | Admit: 2014-01-15 | Discharge: 2014-01-15 | Disposition: A | Discharge status: Home / Self Care | Payer: 59 | Attending: Emergency Medicine | Admitting: Emergency Medicine

## 2014-01-15 DIAGNOSIS — Z880 Allergy status to penicillin: Secondary | ICD-10-CM | POA: Insufficient documentation

## 2014-01-15 DIAGNOSIS — Z8742 Personal history of other diseases of the female genital tract: Secondary | ICD-10-CM | POA: Insufficient documentation

## 2014-01-15 DIAGNOSIS — IMO0002 Reserved for concepts with insufficient information to code with codable children: Secondary | ICD-10-CM | POA: Insufficient documentation

## 2014-01-15 DIAGNOSIS — J029 Acute pharyngitis, unspecified: Secondary | ICD-10-CM | POA: Insufficient documentation

## 2014-01-15 DIAGNOSIS — J45909 Unspecified asthma, uncomplicated: Secondary | ICD-10-CM

## 2014-01-15 DIAGNOSIS — Z792 Long term (current) use of antibiotics: Secondary | ICD-10-CM | POA: Insufficient documentation

## 2014-01-15 DIAGNOSIS — D509 Iron deficiency anemia, unspecified: Secondary | ICD-10-CM | POA: Insufficient documentation

## 2014-01-15 DIAGNOSIS — J45901 Unspecified asthma with (acute) exacerbation: Secondary | ICD-10-CM | POA: Insufficient documentation

## 2014-01-15 DIAGNOSIS — Z79899 Other long term (current) drug therapy: Secondary | ICD-10-CM | POA: Insufficient documentation

## 2014-01-15 DIAGNOSIS — Z91040 Latex allergy status: Secondary | ICD-10-CM | POA: Insufficient documentation

## 2014-01-15 HISTORY — DX: Unspecified asthma, uncomplicated: J45.909

## 2014-01-15 MED ORDER — PREDNISONE 50 MG PO TABS: 60.0000 mg | ORAL_TABLET | Freq: Every day | ORAL | Status: DC

## 2014-01-15 MED ORDER — AZITHROMYCIN 250 MG PO TABS
500.0000 mg | ORAL_TABLET | Freq: Once | ORAL | Status: AC
  Administered 2014-01-15: 500 mg via ORAL
  Filled 2014-01-15: qty 2

## 2014-01-15 MED ORDER — PREDNISONE 10 MG PO TABS: ORAL_TABLET | ORAL | Status: DC

## 2014-01-15 MED ORDER — ALBUTEROL SULFATE (2.5 MG/3ML) 0.083% IN NEBU
2.5000 mg | INHALATION_SOLUTION | Freq: Once | RESPIRATORY_TRACT | Status: AC
  Administered 2014-01-15: 2.5 mg via RESPIRATORY_TRACT
  Filled 2014-01-15: qty 3

## 2014-01-15 MED ORDER — PREDNISONE 10 MG PO TABS
ORAL_TABLET | ORAL | Status: AC
  Administered 2014-01-15: 10 mg
  Filled 2014-01-15: qty 1

## 2014-01-15 MED ORDER — ALBUTEROL SULFATE (2.5 MG/3ML) 0.083% IN NEBU
5.0000 mg | INHALATION_SOLUTION | Freq: Once | RESPIRATORY_TRACT | Status: AC
Start: 2014-01-15 — End: 2014-01-15
  Administered 2014-01-15: 5 mg via RESPIRATORY_TRACT
  Filled 2014-01-15: qty 6

## 2014-01-15 MED ORDER — IPRATROPIUM-ALBUTEROL 0.5-2.5 (3) MG/3ML IN SOLN
3.0000 mL | Freq: Once | RESPIRATORY_TRACT | Status: AC
Start: 2014-01-15 — End: 2014-01-15
  Administered 2014-01-15: 3 mL via RESPIRATORY_TRACT
  Filled 2014-01-15: qty 3

## 2014-01-15 MED ORDER — PREDNISONE 50 MG PO TABS
ORAL_TABLET | ORAL | Status: AC
  Administered 2014-01-15: 50 mg
  Filled 2014-01-15: qty 1

## 2014-01-15 MED ORDER — AZITHROMYCIN 250 MG PO TABS: 250.0000 mg | ORAL_TABLET | Freq: Every day | ORAL | Status: DC

## 2014-01-15 NOTE — ED Provider Notes (Signed)
CSN: [id number]     Arrival date & time 01/15/14  1946 History   First MD Initiated Contact with Patient 01/15/14 2012     Chief Complaint  Patient presents with  . Cough  . Sore Throat   (Consider location/radiation/quality/duration/timing/severity/associated sxs/prior Treatment) Patient is a 48 y.o. female presenting with cough and pharyngitis. The history is provided by the patient. No language interpreter was used.  Cough Cough characteristics:  Productive Sputum characteristics:  Nondescript Severity:  Moderate Onset quality:  Gradual Duration:  2 weeks Timing:  Constant Progression:  Worsening Chronicity:  New Smoker: no   Relieved by:  Nothing Worsened by:  Nothing tried Ineffective treatments:  None tried Associated symptoms: rhinorrhea, shortness of breath and wheezing   Sore Throat Associated symptoms include coughing.    Past Medical History  Diagnosis Date  . Mass of breast, left 12/2011    fibroglandular tissue  mammo ?[place]   . Microcytic anemia 09/09/2013  . Leukocytosis, unspecified 10/10/2013  . PCOS (polycystic ovarian syndrome) 10/10/2013  . Asthma    Past Surgical History  Procedure Laterality Date  . Abdominal hysterectomy    . Appendectomy    . Cholecystectomy    . Hernia repair    . Back surgery    . Cesarean section     No family history on file. History  Substance Use Topics  . Smoking status: Never Smoker   . Smokeless tobacco: Not on file  . Alcohol Use: No   OB History   Grav Para Term Preterm Abortions TAB SAB Ect Mult Living                 Review of Systems  HENT: Positive for rhinorrhea.   Respiratory: Positive for cough, shortness of breath and wheezing.   All other systems reviewed and are negative.    Allergies  Asa; Latex; Terramycin; Penicillins; Promethazine hcl; and Sulfa antibiotics  Home Medications   Current Outpatient Rx  Name  Route  Sig  Dispense  Refill  . albuterol (PROVENTIL HFA;VENTOLIN HFA) 108 (90  BASE) MCG/ACT inhaler   Inhalation   Inhale 2 puffs into the lungs every 6 (six) hours as needed for wheezing.         Marland Kitchen atorvastatin (LIPITOR) 20 MG tablet   Oral   Take 1 tablet (20 mg total) by mouth daily.   90 tablet   3   . azithromycin (ZITHROMAX) 250 MG tablet   Oral   Take 1 tablet (250 mg total) by mouth daily. Take first 2 tablets together, then 1 every day until finished.   6 tablet   0   . citalopram (CELEXA) 40 MG tablet   Oral   Take 1 tablet (40 mg total) by mouth daily.   30 tablet   5   . EPINEPHrine (EPIPEN) 0.3 mg/0.3 mL DEVI   Intramuscular   Inject 0.3 mLs (0.3 mg total) into the muscle once.   1 Device   1   . Fluticasone-Salmeterol (ADVAIR DISKUS) 500-50 MCG/DOSE AEPB   Inhalation   Inhale 1 puff into the lungs 2 (two) times daily.         . iron polysaccharides (NIFEREX) 150 MG capsule   Oral   Take 1 capsule (150 mg total) by mouth 2 (two) times daily.   60 capsule   11   . montelukast (SINGULAIR) 10 MG tablet   Oral   Take 1 tablet (10 mg total) by mouth at bedtime.  30 tablet   11   . predniSONE (DELTASONE) 10 MG tablet      Take 5 tabs by mouth a day for five days as directed   25 tablet   3   . predniSONE (DELTASONE) 10 MG tablet      6,5,4,3,2,1 taper   21 tablet   0   . Triamcinolone Acetonide (NASACORT ALLERGY 24HR NA)   Nasal   Place 2 sprays into the nose daily.          BP 123/62  Pulse 98  Temp(Src) 98.3 F (36.8 C) (Oral)  Resp 16  Ht 5' 1.5" (1.562 m)  Wt 176 lb (79.833 kg)  BMI 32.72 kg/m2  SpO2 96% Physical Exam  Nursing note and vitals reviewed. Constitutional: She appears well-developed and well-nourished.  HENT:  Head: Normocephalic and atraumatic.  Eyes: Conjunctivae are normal. Pupils are equal, round, and reactive to light.  Neck: Normal range of motion. Neck supple.  Cardiovascular: Normal rate.   Pulmonary/Chest: She has wheezes.  Abdominal: Soft.  Musculoskeletal: Normal range of  motion.  Neurological: She is alert.  Skin: Skin is warm.  Psychiatric: She has a normal mood and affect.    ED Course  Procedures (including critical care time) Labs Review Labs Reviewed - No data to display Imaging Review No results found.  EKG Interpretation   None       MDM   1. Asthma    Pt is improving after neb treatment.   Pt given prednisone and zithromax.   Pt has albuterol at home.    Fransico Meadow, PA-C 01/15/14 2159  Fransico Meadow, PA-C 01/15/14 2206

## 2014-01-15 NOTE — ED Notes (Signed)
Cough and sore throat. Hx of asthma and states she is having issues with her asthma. Just finished a round of antibiotics.

## 2014-01-15 NOTE — Progress Notes (Signed)
Patient states that she no longer take Phenergan and has not done so in years

## 2014-01-15 NOTE — ED Provider Notes (Signed)
Medical screening examination/treatment/procedure(s) were performed by non-physician practitioner and as supervising physician I was immediately available for consultation/collaboration.     Veryl Speak, MD 01/15/14 [phone]

## 2014-01-15 NOTE — Discharge Instructions (Signed)
Asthma Attack Prevention Although there is no way to prevent asthma from starting, you can take steps to control the disease and reduce its symptoms. Learn about your asthma and how to control it. Take an active role to control your asthma by working with your health care provider to create and follow an asthma action plan. An asthma action plan guides you in:  Taking your medicines properly.  Avoiding things that set off your asthma or make your asthma worse (asthma triggers).  Tracking your level of asthma control.  Responding to worsening asthma.  Seeking emergency care when needed. To track your asthma, keep records of your symptoms, check your peak flow number using a handheld device that shows how well air moves out of your lungs (peak flow meter), and get regular asthma checkups.  WHAT ARE SOME WAYS TO PREVENT AN ASTHMA ATTACK?  Take medicines as directed by your health care provider.  Keep track of your asthma symptoms and level of control.  With your health care provider, write a detailed plan for taking medicines and managing an asthma attack. Then be sure to follow your action plan. Asthma is an ongoing condition that needs regular monitoring and treatment.  Identify and avoid asthma triggers. Many outdoor allergens and irritants (such as pollen, mold, cold air, and air pollution) can trigger asthma attacks. Find out what your asthma triggers are and take steps to avoid them.  Monitor your breathing. Learn to recognize warning signs of an attack, such as coughing, wheezing, or shortness of breath. Your lung function may decrease before you notice any signs or symptoms, so regularly measure and record your peak airflow with a home peak flow meter.  Identify and treat attacks early. If you act quickly, you are less likely to have a severe attack. You will also need less medicine to control your symptoms. When your peak flow measurements decrease and alert you to an upcoming attack,  take your medicine as instructed and immediately stop any activity that may have triggered the attack. If your symptoms do not improve, get medical help.  Pay attention to increasing quick-relief inhaler use. If you find yourself relying on your quick-relief inhaler, your asthma is not under control. See your health care provider about adjusting your treatment. WHAT CAN MAKE MY SYMPTOMS WORSE? A number of common things can set off or make your asthma symptoms worse and cause temporary increased inflammation of your airways. Keep track of your asthma symptoms for several weeks, detailing all the environmental and emotional factors that are linked with your asthma. When you have an asthma attack, go back to your asthma diary to see which factor, or combination of factors, might have contributed to it. Once you know what these factors are, you can take steps to control many of them. If you have allergies and asthma, it is important to take asthma prevention steps at home. Minimizing contact with the substance to which you are allergic will help prevent an asthma attack. Some triggers and ways to avoid these triggers are: Animal Dander:  Some people are allergic to the flakes of skin or dried saliva from animals with fur or feathers.   There is no such thing as a hypoallergenic dog or cat breed. All dogs or cats can cause allergies, even if they don't shed.  Keep these pets out of your home.  If you are not able to keep a pet outdoors, keep the pet out of your bedroom and other sleeping areas at all  times, and keep the door closed.  Remove carpets and furniture covered with cloth from your home. If that is not possible, keep the pet away from fabric-covered furniture and carpets. Dust Mites: Many people with asthma are allergic to dust mites. Dust mites are tiny bugs that are found in every home in mattresses, pillows, carpets, fabric-covered furniture, bedcovers, clothes, stuffed toys, and other  fabric-covered items.   Cover your mattress in a special dust-proof cover.  Cover your pillow in a special dust-proof cover, or wash the pillow each week in hot water. Water must be hotter than 130 F (54.4 C) to kill dust mites. Cold or warm water used with detergent and bleach can also be effective.  Wash the sheets and blankets on your bed each week in hot water.  Try not to sleep or lie on cloth-covered cushions.  Call ahead when traveling and ask for a smoke-free hotel room. Bring your own bedding and pillows in case the hotel only supplies feather pillows and down comforters, which may contain dust mites and cause asthma symptoms.  Remove carpets from your bedroom and those laid on concrete, if you can.  Keep stuffed toys out of the bed, or wash the toys weekly in hot water or cooler water with detergent and bleach. Cockroaches: Many people with asthma are allergic to the droppings and remains of cockroaches.   Keep food and garbage in closed containers. Never leave food out.  Use poison baits, traps, powders, gels, or paste (for example, boric acid).  If a spray is used to kill cockroaches, stay out of the room until the odor goes away. Indoor Mold:  Fix leaky faucets, pipes, or other sources of water that have mold around them.  Clean floors and moldy surfaces with a fungicide or diluted bleach.  Avoid using humidifiers, vaporizers, or swamp coolers. These can spread molds through the air. Pollen and Outdoor Mold:  When pollen or mold spore counts are high, try to keep your windows closed.  Stay indoors with windows closed from late morning to afternoon. Pollen and some mold spore counts are highest at that time.  Ask your health care provider whether you need to take anti-inflammatory medicine or increase your dose of the medicine before your allergy season starts. Other Irritants to Avoid:  Tobacco smoke is an irritant. If you smoke, ask your health care provider how  you can quit. Ask family members to quit smoking too. Do not allow smoking in your home or car.  If possible, do not use a wood-burning stove, kerosene heater, or fireplace. Minimize exposure to all sources of smoke, including to incense, candles, fires, and fireworks.  Try to stay away from strong odors and sprays, such as perfume, talcum powder, hair spray, and paints.  Decrease humidity in your home and use an indoor air cleaning device. Reduce indoor humidity to below 60%. Dehumidifiers or central air conditioners can do this.  Decrease house dust exposure by changing furnace and air cooler filters frequently.  Try to have someone else vacuum for you once or twice a week. Stay out of rooms while they are being vacuumed and for a short while afterward.  If you vacuum, use a dust mask from a hardware store, a double-layered or microfilter vacuum cleaner bag, or a vacuum cleaner with a HEPA filter.  Sulfites in foods and beverages can be irritants. Do not drink beer or wine or eat dried fruit, processed potatoes, or shrimp if they cause asthma symptoms.  Cold air can trigger an asthma attack. Cover your nose and mouth with a scarf on cold or windy days.  Several health conditions can make asthma more difficult to manage, including a runny nose, sinus infections, reflux disease, psychological stress, and sleep apnea. Work with your health care provider to manage these conditions.  Avoid close contact with people who have a respiratory infection such as a cold or the flu, since your asthma symptoms may get worse if you catch the infection. Wash your hands thoroughly after touching items that may have been handled by people with a respiratory infection.  Get a flu shot every year to protect against the flu virus, which often makes asthma worse for days or weeks. Also get a pneumonia shot if you have not previously had one. Unlike the flu shot, the pneumonia shot does not need to be given  yearly. Medicines:  Talk to your health care provider about whether it is safe for you to take aspirin or non-steroidal anti-inflammatory medicines (NSAIDs). In a small number of people with asthma, aspirin and NSAIDs can cause asthma attacks. These medicines must be avoided by people who have known aspirin-sensitive asthma. It is important that people with aspirin-sensitive asthma read labels of all over-the-counter medicines used to treat pain, colds, coughs, and fever.  Beta blockers and ACE inhibitors are other medicines you should discuss with your health care provider. HOW CAN I FIND OUT WHAT I AM ALLERGIC TO? Ask your asthma health care provider about allergy skin testing or blood testing (the RAST test) to identify the allergens to which you are sensitive. If you are found to have allergies, the most important thing to do is to try to avoid exposure to any allergens that you are sensitive to as much as possible. Other treatments for allergies, such as medicines and allergy shots (immunotherapy) are available.  CAN I EXERCISE? Follow your health care provider's advice regarding asthma treatment before exercising. It is important to maintain a regular exercise program, but vigorous exercise, or exercise in cold, humid, or dry environments can cause asthma attacks, especially for those people who have exercise-induced asthma. Document Released: 12/01/2009 Document Revised: 08/15/2013 Document Reviewed: 06/20/2013 [facility] Patient Information 2014 [place].  Asthma, Adult Asthma is a recurring condition in which the airways tighten and narrow. Asthma can make it difficult to breathe. It can cause coughing, wheezing, and shortness of breath. Asthma episodes (also called asthma attacks) range from minor to life-threatening. Asthma cannot be cured, but medicines and lifestyle changes can help control it. CAUSES Asthma is believed to be caused by inherited (genetic) and environmental factors,  but its exact cause is unknown. Asthma may be triggered by allergens, lung infections, or irritants in the air. Asthma triggers are different for each person. Common triggers include:   Animal dander.  Dust mites.  Cockroaches.  Pollen from trees or grass.  Mold.  Smoke.  Air pollutants such as dust, household cleaners, hair sprays, aerosol sprays, paint fumes, strong chemicals, or strong odors.  Cold air, weather changes, and winds (which increase molds and pollens in the air).  Strong emotional expressions such as crying or laughing hard.  Stress.  Certain medicines (such as aspirin) or types of drugs (such as beta-blockers).  Sulfites in foods and drinks. Foods and drinks that may contain sulfites include dried fruit, potato chips, and sparkling grape juice.  Infections or inflammatory conditions such as the flu, a cold, or an inflammation of the nasal membranes (rhinitis).  Gastroesophageal  reflux disease (GERD).  Exercise or strenuous activity. SYMPTOMS Symptoms may occur immediately after asthma is triggered or many hours later. Symptoms include:  Wheezing.  Excessive nighttime or early morning coughing.  Frequent or severe coughing with a common cold.  Chest tightness.  Shortness of breath. DIAGNOSIS  The diagnosis of asthma is made by a review of your medical history and a physical exam. Tests may also be performed. These may include:  Lung function studies. These tests show how much air you breath in and out.  Allergy tests.  Imaging tests such as X-rays. TREATMENT  Asthma cannot be cured, but it can usually be controlled. Treatment involves identifying and avoiding your asthma triggers. It also involves medicines. There are 2 classes of medicine used for asthma treatment:   Controller medicines. These prevent asthma symptoms from occurring. They are usually taken every day.  Reliever or rescue medicines. These quickly relieve asthma symptoms. They are  used as needed and provide short-term relief. Your health care provider will help you create an asthma action plan. An asthma action plan is a written plan for managing and treating your asthma attacks. It includes a list of your asthma triggers and how they may be avoided. It also includes information on when medicines should be taken and when their dosage should be changed. An action plan may also involve the use of a device called a peak flow meter. A peak flow meter measures how well the lungs are working. It helps you monitor your condition. HOME CARE INSTRUCTIONS   Take medicine as directed by your health care provider. Speak with your health care provider if you have questions about how or when to take the medicines.  Use a peak flow meter as directed by your health care provider. Record and keep track of readings.  Understand and use the action plan to help minimize or stop an asthma attack without needing to seek medical care.  Control your home environment in the following ways to help prevent asthma attacks:  Do not smoke. Avoid being exposed to secondhand smoke.  Change your heating and air conditioning filter regularly.  Limit your use of fireplaces and wood stoves.  Get rid of pests (such as roaches and mice) and their droppings.  Throw away plants if you see mold on them.  Clean your floors and dust regularly. Use unscented cleaning products.  Try to have someone else vacuum for you regularly. Stay out of rooms while they are being vacuumed and for a short while afterward. If you vacuum, use a dust mask from a hardware store, a double-layered or microfilter vacuum cleaner bag, or a vacuum cleaner with a HEPA filter.  Replace carpet with wood, tile, or vinyl flooring. Carpet can trap dander and dust.  Use allergy-proof pillows, mattress covers, and box spring covers.  Wash bed sheets and blankets every week in hot water and dry them in a dryer.  Use blankets that are  made of polyester or cotton.  Clean bathrooms and kitchens with bleach. If possible, have someone repaint the walls in these rooms with mold-resistant paint. Keep out of the rooms that are being cleaned and painted.  Wash hands frequently. SEEK MEDICAL CARE IF:   You have wheezing, shortness of breath, or a cough even if taking medicine to prevent attacks.  The colored mucus you cough up (sputum) is thicker than usual.  Your sputum changes from clear or white to yellow, green, gray, or bloody.  You have any problems  problems that may be related to the medicines you are taking (such as a rash, itching, swelling, or trouble breathing).  You are using a reliever medicine more than 2 3 times per week.  Your peak flow is still at 50 79% of you personal best after following your action plan for 1 hour. SEEK IMMEDIATE MEDICAL CARE IF:   You seem to be getting worse and are unresponsive to treatment during an asthma attack.  You are short of breath even at rest.  You get short of breath when doing very little physical activity.  You have difficulty eating, drinking, or talking due to asthma symptoms.  You develop chest pain.  You develop a fast heartbeat.  You have a bluish color to your lips or fingernails.  You are lightheaded, dizzy, or faint.  Your peak flow is less than 50% of your personal best.  You have a fever or persistent symptoms for more than 2 3 days.  You have a fever and symptoms suddenly get worse. MAKE SURE YOU:   Understand these instructions.  Will watch your condition.  Will get help right away if you are not doing well or get worse. Document Released: 12/13/2005 Document Revised: 08/15/2013 Document Reviewed: 07/12/2013 [employer] Patient Information 2014 [employer].  

## 2014-01-25 ENCOUNTER — Other Ambulatory Visit: Payer: 59

## 2014-01-25 ENCOUNTER — Ambulatory Visit: Payer: 59 | Admitting: Oncology

## 2014-02-19 ENCOUNTER — Telehealth: Payer: Self-pay | Admitting: Sports Medicine

## 2014-02-19 MED ORDER — BETAMETHASONE DIPROPIONATE 0.05 % EX OINT: TOPICAL_OINTMENT | Freq: Two times a day (BID) | CUTANEOUS | Status: DC

## 2014-02-19 NOTE — Telephone Encounter (Signed)
Pt seen in passing    Subjective Report:  1 day acute onset of burning and itching on posterior aspect of left thigh.  No fevers, chills.  No n/v or other systemic symptoms.  No known injury or exposure  Trying hydrocortisone cream with minimal relief  Tylenol helping pain    Objective:  Skin: left posterior thigh with rectangular area of beefy red with small vesicles within lesion.  Well demarkated, no surrounding erythema     Assessment & Plan & Follow up Issues:  Contact Dermatitis Acute condition, unknown exposure 1. Rx Betamethasone Ointment for 10 days > Reviewed red flags.  Will schedule follow up appointment with PCP/me if not improved

## 2014-02-24 ENCOUNTER — Encounter: Payer: Self-pay | Admitting: Oncology

## 2014-03-14 ENCOUNTER — Other Ambulatory Visit: Payer: Self-pay | Admitting: *Deleted

## 2014-03-14 DIAGNOSIS — F411 Generalized anxiety disorder: Secondary | ICD-10-CM

## 2014-03-14 MED ORDER — CITALOPRAM HYDROBROMIDE 40 MG PO TABS: 40.0000 mg | ORAL_TABLET | Freq: Every day | ORAL | Status: DC

## 2014-03-15 ENCOUNTER — Other Ambulatory Visit: Payer: Self-pay | Admitting: Family Medicine

## 2014-03-28 ENCOUNTER — Other Ambulatory Visit: Payer: Self-pay | Admitting: *Deleted

## 2014-03-28 MED ORDER — ALBUTEROL SULFATE HFA 108 (90 BASE) MCG/ACT IN AERS: 2.0000 | INHALATION_SPRAY | Freq: Four times a day (QID) | RESPIRATORY_TRACT | Status: DC | PRN

## 2014-05-27 ENCOUNTER — Ambulatory Visit (INDEPENDENT_AMBULATORY_CARE_PROVIDER_SITE_OTHER): Payer: 59 | Admitting: Family Medicine

## 2014-05-27 DIAGNOSIS — M25539 Pain in unspecified wrist: Secondary | ICD-10-CM

## 2014-05-27 MED ORDER — METHYLPREDNISOLONE ACETATE 40 MG/ML IJ SUSP
40.0000 mg | Freq: Once | INTRAMUSCULAR | Status: AC
Start: 2014-05-27 — End: 2014-05-27
  Administered 2014-05-27: 40 mg via INTRA_ARTICULAR

## 2014-05-27 NOTE — Progress Notes (Signed)
Tammy White is a 48 y.o. female who presents to [facility] today for bilateral wrist/hand pain  Bilateral hand: hand numbness in the ventral surfaces of the 1- medial 4th digits. Wrist splints w/o benefit. Now constant. Now w/ some weakness of the thumb. Progressive. NSAIDs w/o releif.   The following portions of the patient's history were reviewed and updated as appropriate: allergies, current medications, past medical history, family and social history, and problem list.  Patient is a nonsmoker.  Past Medical History  Diagnosis Date  . Mass of breast, left 12/2011    fibroglandular tissue  mammo ?[place]   . Microcytic anemia 09/09/2013  . Leukocytosis, unspecified 10/10/2013  . PCOS (polycystic ovarian syndrome) 10/10/2013  . Asthma     ROS as above otherwise neg.    Medications reviewed. Current Outpatient Prescriptions  Medication Sig Dispense Refill  . albuterol (PROVENTIL HFA;VENTOLIN HFA) 108 (90 BASE) MCG/ACT inhaler Inhale 2 puffs into the lungs every 6 (six) hours as needed for wheezing.  1 Inhaler  5  . atorvastatin (LIPITOR) 20 MG tablet Take 1 tablet (20 mg total) by mouth daily.  90 tablet  3  . azithromycin (ZITHROMAX) 250 MG tablet Take 1 tablet (250 mg total) by mouth daily. Take first 2 tablets together, then 1 every day until finished.  6 tablet  0  . betamethasone dipropionate (DIPROLENE) 0.05 % ointment Apply topically 2 (two) times daily. For 7 days  50 g  0  . citalopram (CELEXA) 40 MG tablet TAKE 1 TABLET BY MOUTH ONCE DAILY  30 tablet  0  . EPINEPHrine (EPIPEN) 0.3 mg/0.3 mL DEVI Inject 0.3 mLs (0.3 mg total) into the muscle once.  1 Device  1  . Fluticasone-Salmeterol (ADVAIR DISKUS) 500-50 MCG/DOSE AEPB Inhale 1 puff into the lungs 2 (two) times daily.      . iron polysaccharides (NIFEREX) 150 MG capsule Take 1 capsule (150 mg total) by mouth 2 (two) times daily.  60 capsule  11  . montelukast (SINGULAIR) 10 MG tablet Take 1 tablet (10 mg total) by mouth at bedtime.  30  tablet  11  . predniSONE (DELTASONE) 10 MG tablet Take 5 tabs by mouth a day for five days as directed  25 tablet  3  . predniSONE (DELTASONE) 10 MG tablet 6,5,4,3,2,1 taper  21 tablet  0  . Triamcinolone Acetonide (NASACORT ALLERGY 24HR NA) Place 2 sprays into the nose daily.       No current facility-administered medications for this visit.    Exam:  There were no vitals taken for this visit. Gen: Well NAD HEENT: EOMI,  MMM MSK:   No results found for this or any previous visit (from the past 6 hour(s)).  A/P (as seen in Problem list)  Wrist pain Most likely bilat carpal tunnel Conservative measures w/o benefit worsening Discussion had regarding benefits and risks of injection and likely evenuatl recurrance of symptoms.  Injection today  Carpal Tunnel Injection Procedure Note 05/27/14  Pre-operative Diagnosis: right carpal tunnel syndrome  Post-operative Diagnosis: normal, same  Indications: failure of conservative therapy  Procedure Details  Verbal consent for the procedure was obtained. After a sterile Betadine prep, the skin and subcutaneous layer overlying the carpal tunnel were anesthetized with 1% lidocaine. Under Ultrasound guidance a needle was advanced into the carpal tunnel space, carefully avoiding the nerve.  Free flow into the carpal tunnel space was confirmed with anesthesia injection.  The tunnel was then injected with 1 ml of 40mg  Depomedrol.  The area was cleansed with isopropyl alcohol and a dressing was applied.  Complications:  None; patient tolerated the procedure well.

## 2014-05-28 ENCOUNTER — Ambulatory Visit: Payer: 59 | Admitting: Family Medicine

## 2014-05-28 DIAGNOSIS — M25539 Pain in unspecified wrist: Secondary | ICD-10-CM | POA: Insufficient documentation

## 2014-05-28 NOTE — Assessment & Plan Note (Signed)
Most likely bilat carpal tunnel Conservative measures w/o benefit worsening Discussion had regarding benefits and risks of injection and likely evenuatl recurrance of symptoms.  Injection today

## 2014-05-29 ENCOUNTER — Ambulatory Visit (INDEPENDENT_AMBULATORY_CARE_PROVIDER_SITE_OTHER): Payer: 59 | Admitting: Family Medicine

## 2014-05-29 DIAGNOSIS — M25539 Pain in unspecified wrist: Secondary | ICD-10-CM

## 2014-05-29 MED ORDER — METHYLPREDNISOLONE ACETATE 40 MG/ML IJ SUSP
40.0000 mg | Freq: Once | INTRAMUSCULAR | Status: AC
  Administered 2014-05-29: 40 mg via INTRA_ARTICULAR

## 2014-05-30 NOTE — Assessment & Plan Note (Signed)
L wrist pain most consistent w/ Carpel Tunnel Some relief from steroid injection of R wrist 2 days prior.  Injection of L wrist today as outlined in note (40mg  depomedrol)

## 2014-05-30 NOTE — Progress Notes (Signed)
Tammy White is a 48 y.o. female who presents to [facility] today for bilateral wrist/hand pain  Bilateral hand: hand numbness in the ventral surfaces of the 1- medial 4th digits. Wrist splints w/o benefit. Now constant. Now w/ some weakness of the thumb. Progressive. NSAIDs w/o releif.   The following portions of the patient's history were reviewed and updated as appropriate: allergies, current medications, past medical history, family and social history, and problem list.  Patient is a nonsmoker.  Past Medical History  Diagnosis Date  . Mass of breast, left 12/2011    fibroglandular tissue  mammo ?[place]   . Microcytic anemia 09/09/2013  . Leukocytosis, unspecified 10/10/2013  . PCOS (polycystic ovarian syndrome) 10/10/2013  . Asthma     ROS as above otherwise neg.    Medications reviewed. Current Outpatient Prescriptions  Medication Sig Dispense Refill  . albuterol (PROVENTIL HFA;VENTOLIN HFA) 108 (90 BASE) MCG/ACT inhaler Inhale 2 puffs into the lungs every 6 (six) hours as needed for wheezing.  1 Inhaler  5  . atorvastatin (LIPITOR) 20 MG tablet Take 1 tablet (20 mg total) by mouth daily.  90 tablet  3  . azithromycin (ZITHROMAX) 250 MG tablet Take 1 tablet (250 mg total) by mouth daily. Take first 2 tablets together, then 1 every day until finished.  6 tablet  0  . betamethasone dipropionate (DIPROLENE) 0.05 % ointment Apply topically 2 (two) times daily. For 7 days  50 g  0  . citalopram (CELEXA) 40 MG tablet TAKE 1 TABLET BY MOUTH ONCE DAILY  30 tablet  0  . EPINEPHrine (EPIPEN) 0.3 mg/0.3 mL DEVI Inject 0.3 mLs (0.3 mg total) into the muscle once.  1 Device  1  . Fluticasone-Salmeterol (ADVAIR DISKUS) 500-50 MCG/DOSE AEPB Inhale 1 puff into the lungs 2 (two) times daily.      . iron polysaccharides (NIFEREX) 150 MG capsule Take 1 capsule (150 mg total) by mouth 2 (two) times daily.  60 capsule  11  . montelukast (SINGULAIR) 10 MG tablet Take 1 tablet (10 mg total) by mouth at bedtime.  30  tablet  11  . predniSONE (DELTASONE) 10 MG tablet Take 5 tabs by mouth a day for five days as directed  25 tablet  3  . predniSONE (DELTASONE) 10 MG tablet 6,5,4,3,2,1 taper  21 tablet  0  . Triamcinolone Acetonide (NASACORT ALLERGY 24HR NA) Place 2 sprays into the nose daily.       No current facility-administered medications for this visit.    Exam:  There were no vitals taken for this visit. Gen: Well NAD HEENT: EOMI,  MMM   No results found for this or any previous visit (from the past 76 hour(s)).  A/P (as seen in Problem list)  Wrist pain L wrist pain most consistent w/ Carpel Tunnel Some relief from steroid injection of R wrist 2 days prior.  Injection of L wrist today as outlined in note (40mg  depomedrol)  Carpal Tunnel Injection Procedure Note 05/27/14  Pre-operative Diagnosis: L carpal tunnel syndrome  Post-operative Diagnosis: normal, same  Indications: failure of conservative therapy  Procedure Details  Verbal consent for the procedure was obtained. After a sterile Betadine prep, the skin and subcutaneous layer overlying the carpal tunnel were anesthetized with 1% lidocaine. Under Ultrasound guidance a needle was advanced into the carpal tunnel space, carefully avoiding the nerve.  Free flow into the carpal tunnel space was confirmed with anesthesia injection.  The tunnel was then injected with 1 ml  of 40mg  Depomedrol. The area was cleansed with isopropyl alcohol and a dressing was applied.  Complications:  None; patient tolerated the procedure well.

## 2014-06-05 ENCOUNTER — Ambulatory Visit (INDEPENDENT_AMBULATORY_CARE_PROVIDER_SITE_OTHER): Payer: 59 | Admitting: Family Medicine

## 2014-06-05 DIAGNOSIS — R109 Unspecified abdominal pain: Secondary | ICD-10-CM

## 2014-06-05 MED ORDER — SUCRALFATE 1 G PO TABS: 1.0000 g | ORAL_TABLET | Freq: Three times a day (TID) | ORAL | Status: DC

## 2014-06-05 NOTE — Progress Notes (Signed)
   Subjective:    Patient ID: Tammy White, female    DOB: 1966-06-01, 48 y.o.   MRN: [id number]  HPI  2-3 days of stomach burning. Feels like gastritis. Has had some mild increase in her typically loose stools with some occasional nausea and stomach cramping. No blood in her stools. No change in her appetite, no emesis. Says it feels like it did when she had gallbladder attacks. She status post cholecystectomy.   PERTINENT  PMH / PSH: I have reviewed the patient's medications, allergies, past medical and surgical history. Pertinent findings that relate to today's visit / issues include:  Review of Systems No fever. See HPI above additionally.    Objective:   Physical Exam  Vital signs reviewed. GENERAL: Well-developed, well-nourished, no acute distress. CARDIOVASCULAR: Regular rate and rhythm no murmur gallop or rub LUNGS: Clear to auscultation bilaterally, no rales or wheeze. ABDOMEN: Soft positive bowel sounds. No rebound or guarding. No masses noted. Well healed the scar abdominal incision. Mild reducible midline incisional hernia. NEURO: No gross focal neurological deficits. MSK: Movement of extremity x 4.        Assessment & Plan:  Abdominal  Pain in patient with prior history of multiple bowel surgeries. It sounds like gastritis. She's currently already  on PPI so we'll add sucralfate 1 g,  4 times a day. If not improving I think we need to have endoscopy done. Followup one week, sooner if problems.

## 2014-06-20 ENCOUNTER — Other Ambulatory Visit: Payer: Self-pay | Admitting: Family Medicine

## 2014-06-20 MED ORDER — METOCLOPRAMIDE HCL 10 MG PO TABS: 10.0000 mg | ORAL_TABLET | Freq: Two times a day (BID) | ORAL | Status: DC

## 2014-06-25 ENCOUNTER — Other Ambulatory Visit: Payer: Self-pay | Admitting: Family Medicine

## 2014-06-25 MED ORDER — METFORMIN HCL 500 MG PO TABS: 500.0000 mg | ORAL_TABLET | Freq: Every day | ORAL | Status: DC

## 2014-06-25 NOTE — Progress Notes (Signed)
Wants weight loss med. We discussed. Tammy White try metformin

## 2014-07-03 ENCOUNTER — Other Ambulatory Visit: Payer: Self-pay | Admitting: Family Medicine

## 2014-07-03 DIAGNOSIS — M79641 Pain in right hand: Secondary | ICD-10-CM | POA: Insufficient documentation

## 2014-07-16 ENCOUNTER — Other Ambulatory Visit: Payer: Self-pay | Admitting: Family Medicine

## 2014-07-16 ENCOUNTER — Other Ambulatory Visit: Payer: Self-pay | Admitting: *Deleted

## 2014-07-16 MED ORDER — EPINEPHRINE 0.3 MG/0.3ML IJ SOAJ: 0.3000 mg | Freq: Once | INTRAMUSCULAR | Status: AC

## 2014-08-14 ENCOUNTER — Encounter: Payer: Self-pay | Admitting: Family Medicine

## 2014-08-19 ENCOUNTER — Encounter: Payer: Self-pay | Admitting: Family Medicine

## 2014-10-16 ENCOUNTER — Other Ambulatory Visit: Payer: Self-pay | Admitting: Family Medicine

## 2014-10-16 MED ORDER — DOXYCYCLINE HYCLATE 100 MG PO CAPS: 100.0000 mg | ORAL_CAPSULE | Freq: Two times a day (BID) | ORAL | Status: DC

## 2014-10-18 ENCOUNTER — Other Ambulatory Visit (INDEPENDENT_AMBULATORY_CARE_PROVIDER_SITE_OTHER): Payer: 59 | Admitting: Family Medicine

## 2014-10-18 ENCOUNTER — Other Ambulatory Visit: Payer: Self-pay | Admitting: Family Medicine

## 2014-10-18 DIAGNOSIS — J45901 Unspecified asthma with (acute) exacerbation: Secondary | ICD-10-CM

## 2014-10-18 MED ORDER — METHYLPREDNISOLONE SODIUM SUCC 125 MG IJ SOLR
125.0000 mg | Freq: Once | INTRAMUSCULAR | Status: AC
  Administered 2014-10-18: 125 mg via INTRAMUSCULAR

## 2014-10-18 MED ORDER — SODIUM CHLORIDE 0.9 % IV SOLN: 125.0000 mg | Freq: Once | INTRAVENOUS | Status: DC

## 2014-10-18 NOTE — Progress Notes (Signed)
Patient reports worsening asthma symptoms, started on Doxycycline yesterday by PCP, wheezing on exam, will give Solumedrol 125 mg IM x1, patient to complete course of steroid at home

## 2014-10-18 NOTE — Addendum Note (Signed)
Addended by: Burna Forts A on: 10/18/2014 03:37 PM   Modules accepted: Orders

## 2014-11-13 ENCOUNTER — Other Ambulatory Visit: Payer: Self-pay | Admitting: Family Medicine

## 2014-11-15 ENCOUNTER — Other Ambulatory Visit: Payer: Self-pay | Admitting: *Deleted

## 2014-11-15 MED ORDER — CITALOPRAM HYDROBROMIDE 40 MG PO TABS: 40.0000 mg | ORAL_TABLET | Freq: Every day | ORAL | Status: DC

## 2014-11-15 MED ORDER — MONTELUKAST SODIUM 10 MG PO TABS: 10.0000 mg | ORAL_TABLET | Freq: Every day | ORAL | Status: DC

## 2014-11-19 ENCOUNTER — Other Ambulatory Visit: Payer: Self-pay | Admitting: Family Medicine

## 2014-11-20 LAB — LATEX, IGE: Latex: 2.12 kU/L — ABNORMAL HIGH

## 2014-12-16 ENCOUNTER — Other Ambulatory Visit: Payer: Self-pay | Admitting: *Deleted

## 2014-12-16 MED ORDER — MONTELUKAST SODIUM 10 MG PO TABS
10.0000 mg | ORAL_TABLET | Freq: Every day | ORAL | Status: DC
Start: 2014-12-16 — End: 2017-01-04

## 2014-12-16 MED ORDER — CITALOPRAM HYDROBROMIDE 40 MG PO TABS: 40.0000 mg | ORAL_TABLET | Freq: Every day | ORAL | Status: DC

## 2015-01-07 ENCOUNTER — Other Ambulatory Visit: Payer: Self-pay | Admitting: Family Medicine

## 2015-01-07 MED ORDER — CLINDAMYCIN HCL 150 MG PO CAPS: ORAL_CAPSULE | ORAL | Status: DC

## 2015-01-07 NOTE — Progress Notes (Signed)
Patient had an addition to an existing tattoo done 4 days ago and is now having redness, some local swelling and 'fire type" skin pain at site. No fever or chills.  OBJ: shin area of left leg reveals redness and some mild very local swelling--lof is not swollen but skin in area of new tatoo is puffy. No purulence. Mildly warmer than other leg. Calf is soft and no redness there. No ankle swelling. No redness or streaks above new tatoo.  Early cellulitis is my concern. I will see her in my cliic in AM--start clindamycin 450 q 6 hours. If worse, needs to go to ED tonight. Will update tetanus tomorrow as well.

## 2015-01-08 ENCOUNTER — Encounter: Payer: Self-pay | Admitting: Family Medicine

## 2015-01-08 ENCOUNTER — Ambulatory Visit (INDEPENDENT_AMBULATORY_CARE_PROVIDER_SITE_OTHER): Payer: 59 | Admitting: Family Medicine

## 2015-01-08 VITALS — BP 120/80 | HR 110 | Temp 97.9°F | Ht 61.0 in | Wt 175.0 lb

## 2015-01-08 DIAGNOSIS — L03116 Cellulitis of left lower limb: Secondary | ICD-10-CM

## 2015-01-08 LAB — CBC WITH DIFFERENTIAL/PLATELET
BASOS PCT: 0 % (ref 0–1)
Basophils Absolute: 0 10*3/uL (ref 0.0–0.1)
EOS ABS: 0.6 10*3/uL (ref 0.0–0.7)
Eosinophils Relative: 5 % (ref 0–5)
HEMATOCRIT: 32.5 % — AB (ref 36.0–46.0)
Hemoglobin: 10 g/dL — ABNORMAL LOW (ref 12.0–15.0)
Lymphocytes Relative: 34 % (ref 12–46)
Lymphs Abs: 3.8 10*3/uL (ref 0.7–4.0)
MCH: 22.6 pg — AB (ref 26.0–34.0)
MCHC: 30.8 g/dL (ref 30.0–36.0)
MCV: 73.4 fL — ABNORMAL LOW (ref 78.0–100.0)
MONO ABS: 1 10*3/uL (ref 0.1–1.0)
MPV: 8.9 fL (ref 8.6–12.4)
Monocytes Relative: 9 % (ref 3–12)
NEUTROS ABS: 5.9 10*3/uL (ref 1.7–7.7)
Neutrophils Relative %: 52 % (ref 43–77)
Platelets: 659 10*3/uL — ABNORMAL HIGH (ref 150–400)
RBC: 4.43 MIL/uL (ref 3.87–5.11)
RDW: 16.4 % — ABNORMAL HIGH (ref 11.5–15.5)
WBC: 11.3 10*3/uL — ABNORMAL HIGH (ref 4.0–10.5)

## 2015-01-09 ENCOUNTER — Other Ambulatory Visit: Payer: Self-pay | Admitting: Family Medicine

## 2015-01-09 LAB — COMPREHENSIVE METABOLIC PANEL
ALBUMIN: 3.8 g/dL (ref 3.5–5.2)
ALT: 31 U/L (ref 0–35)
AST: 23 U/L (ref 0–37)
Alkaline Phosphatase: 101 U/L (ref 39–117)
BILIRUBIN TOTAL: 0.3 mg/dL (ref 0.2–1.2)
BUN: 12 mg/dL (ref 6–23)
CO2: 24 mEq/L (ref 19–32)
CREATININE: 0.65 mg/dL (ref 0.50–1.10)
Calcium: 9.5 mg/dL (ref 8.4–10.5)
Chloride: 102 mEq/L (ref 96–112)
GLUCOSE: 95 mg/dL (ref 70–99)
Potassium: 4.8 mEq/L (ref 3.5–5.3)
SODIUM: 139 meq/L (ref 135–145)
TOTAL PROTEIN: 7.1 g/dL (ref 6.0–8.3)

## 2015-01-09 MED ORDER — PREDNISONE 20 MG PO TABS: 20.0000 mg | ORAL_TABLET | Freq: Every day | ORAL | Status: DC

## 2015-01-09 NOTE — Progress Notes (Signed)
   Subjective:    Patient ID: Tammy White, female    DOB: January 16, 1966, 49 y.o.   MRN: [id number]  HPI Over the weekend she had an addition to one of her pre-existing tattoos. Used the same tattoo artist. Wilburn Mylar and today she noted some erythema and increasing pain in the area of the new tattoo. Pain continued to progress yesterday. I briefly saw her in place her on clindamycin which she has now had 3 doses. The pain is still there, maybe 10% better. No fever. We marked the area of redness yesterday and it has may be expanded a millimeter or 2 beyond but not substantially. She's here for follow-up. Her last tetanus shot was 3 months ago at employee health.  PERTINENT  PMH / PSH: I have reviewed the patient's medications, allergies, past medical and surgical history. Pertinent findings that relate to today's visit / issues include: No personal history of diabetes mellitus. She does have history of leukocytosis that has been evaluated by oncology. Review of Systems No fever, sweats, chills. Lower extremity tenderness and redness as per history of present illness. No joint pains. Has not had prior reactions from tattoo like this before.    Objective:   Physical Exam  GEN.: Well-developed female no acute distress EXTREMITY: Left lower. Area of the tattoo is close to her shin area. We outlined some erythema that extended about 4-5 mm out from the tattoo border circumferentially. Yesterday the skin looked a little bit tense almost a peau de orange and some areas. Today it is still erythematous, less swelling and I see no sign of peau du orange. The redness is 1-2 mm outside the boundaries I drew yesterday. There is no streaking. The calf is soft. There is no ankle swelling. She has normal flexion and extension of ankle and knee. I will get CBC today for baseline in case this progresses. Given her history of leukocytosis this lab work might be otherwise difficult to interpret. Has been on have any  recent lab work on her kidney function or blood sugar am also check that.    Assessment & Plan:  Cellulitis eft lower extremity in area of new tattoo .  cussed options. She has had some very minor improvement and certainly no worsening. I think if she goes home from work and elevates it  for the next 24-48 hours and continues the antibiotics will likely see resolution. We discussed red flags to watch out for such as worsening pain, fever, extending erythema. Any of these would require reevaluation and possible admission for IV antibiotics. She's going to get me the information about her tetanus shot. She's got a call me in the morning with an update. She knows how to use the on-call after-hours emergency line system.Marland Kitchen

## 2015-01-09 NOTE — Progress Notes (Signed)
Erythema on area thought to be cellulitis is not advancing, no streaking, no fever. Pain has increased however. She sent me a picture via text---of hers and her daughter who also has similar reaction. (See chart Tammy White). They both had same tattoo artist--I am now wondering if this is more an inflammatory reaction to something in the ink rather than an infection. Still not sytemically sick--will try adding 20 mg of prednisone daily for 5 days. She is keeping me informed via phone.

## 2015-01-13 ENCOUNTER — Other Ambulatory Visit: Payer: Self-pay | Admitting: Family Medicine

## 2015-01-13 DIAGNOSIS — L03116 Cellulitis of left lower limb: Secondary | ICD-10-CM

## 2015-01-30 ENCOUNTER — Other Ambulatory Visit: Payer: Self-pay | Admitting: Family Medicine

## 2015-01-30 MED ORDER — ALBUTEROL SULFATE HFA 108 (90 BASE) MCG/ACT IN AERS: 2.0000 | INHALATION_SPRAY | Freq: Four times a day (QID) | RESPIRATORY_TRACT | Status: DC | PRN

## 2015-06-13 ENCOUNTER — Other Ambulatory Visit: Payer: Self-pay | Admitting: Family Medicine

## 2015-10-01 ENCOUNTER — Other Ambulatory Visit: Payer: Self-pay | Admitting: *Deleted

## 2015-10-01 ENCOUNTER — Other Ambulatory Visit (INDEPENDENT_AMBULATORY_CARE_PROVIDER_SITE_OTHER): Payer: BLUE CROSS/BLUE SHIELD | Admitting: *Deleted

## 2015-10-01 DIAGNOSIS — Z6835 Body mass index (BMI) 35.0-35.9, adult: Secondary | ICD-10-CM

## 2015-10-01 DIAGNOSIS — E6609 Other obesity due to excess calories: Secondary | ICD-10-CM

## 2015-10-01 DIAGNOSIS — Z8742 Personal history of other diseases of the female genital tract: Secondary | ICD-10-CM

## 2015-10-01 MED ORDER — FLUTICASONE-SALMETEROL 500-50 MCG/DOSE IN AEPB: 1.0000 | INHALATION_SPRAY | Freq: Two times a day (BID) | RESPIRATORY_TRACT | Status: AC

## 2016-03-01 ENCOUNTER — Other Ambulatory Visit: Payer: Self-pay | Admitting: Family Medicine

## 2016-03-01 DIAGNOSIS — M25572 Pain in left ankle and joints of left foot: Secondary | ICD-10-CM

## 2016-03-01 DIAGNOSIS — J019 Acute sinusitis, unspecified: Secondary | ICD-10-CM

## 2016-03-01 MED ORDER — DOXYCYCLINE HYCLATE 100 MG PO CAPS: 100.0000 mg | ORAL_CAPSULE | Freq: Two times a day (BID) | ORAL | Status: DC

## 2016-03-02 ENCOUNTER — Encounter: Payer: Self-pay | Admitting: Family Medicine

## 2016-03-02 ENCOUNTER — Ambulatory Visit (INDEPENDENT_AMBULATORY_CARE_PROVIDER_SITE_OTHER): Payer: BLUE CROSS/BLUE SHIELD | Admitting: Family Medicine

## 2016-03-02 VITALS — BP 134/73 | HR 101 | Temp 98.0°F

## 2016-03-02 DIAGNOSIS — J45901 Unspecified asthma with (acute) exacerbation: Secondary | ICD-10-CM | POA: Diagnosis not present

## 2016-03-02 MED ORDER — METHYLPREDNISOLONE SODIUM SUCC 125 MG IJ SOLR
125.0000 mg | Freq: Once | INTRAMUSCULAR | Status: AC
  Administered 2016-03-02: 125 mg via INTRAMUSCULAR

## 2016-03-03 NOTE — Assessment & Plan Note (Addendum)
Asthma exacerbation precipitated by sinusitis/URI, diffuse wheezes, dyspnea without hypoxia - continue doxycycline - solumedrol 250mg  IM given in clinic to cover full burst - continue albuterol q4 while awake - continue advair, singulair

## 2016-03-03 NOTE — Progress Notes (Signed)
   Subjective:   Tammy White is a 50 y.o. female with a history of persistent asthma here for cough, dyspnea  COUGH  Has been coughing for 7 days. Cough is: moderate Sputum production: mild Medications tried: currently on doxy for sinus infxn Taking blood pressure medications: no  Symptoms Runny nose: yes Mucous in back of throat: yes Throat burning or reflux: no Wheezing or asthma: yes, severe Fever: no Chest Pain: no Shortness of breath: yes Leg swelling: no Hemoptysis: no Weight loss: no   Review of Systems:  Per HPI. All other systems reviewed and are negative.   PMH, PSH, Medications, Allergies, and FmHx reviewed and updated in EMR.  Social History: never smoker  Objective:  BP 134/73 mmHg  Pulse 101  Temp(Src) 98 F (36.7 C) (Oral)  SpO2 97%  Gen:  50 y.o. female in NAD HEENT: NCAT, MMM, EOMI, PERRL, anicteric sclerae CV: RRR, no MRG, no JVD Resp: Non-labored, diffuse expiratory wheezes in all lung fields Abd: Soft, NTND, BS present, no guarding or organomegaly Ext: WWP, no edema MSK: Full ROM, strength intact Neuro: Alert and oriented, speech normal      Chemistry      Component Value Date/Time   NA 139 01/08/2015 0852   K 4.8 01/08/2015 0852   CL 102 01/08/2015 0852   CO2 24 01/08/2015 0852   BUN 12 01/08/2015 0852   CREATININE 0.65 01/08/2015 0852   CREATININE 0.60 02/09/2011 2255      Component Value Date/Time   CALCIUM 9.5 01/08/2015 0852   ALKPHOS 101 01/08/2015 0852   AST 23 01/08/2015 0852   ALT 31 01/08/2015 0852   BILITOT 0.3 01/08/2015 0852      Lab Results  Component Value Date   WBC 11.3* 01/08/2015   HGB 10.0* 01/08/2015   HCT 32.5* 01/08/2015   MCV 73.4* 01/08/2015   PLT 659* 01/08/2015   Lab Results  Component Value Date   TSH 1.682 08/24/2013   No results found for: HGBA1C Assessment & Plan:     Tammy White is a 50 y.o. female here for asthma exacerbation  Asthma, chronic Asthma exacerbation  precipitated by sinusitis/URI, diffuse wheezes, dyspnea without hypoxia - continue doxycycline - solumedrol 250mg  IM given in clinic to cover full burst - continue albuterol q4 while awake - continue advair, singulair    Beverlyn Roux, MD, MPH [facility] PGY-3 03/03/2016 3:31 PM

## 2016-06-02 ENCOUNTER — Other Ambulatory Visit: Payer: Self-pay | Admitting: Family Medicine

## 2016-06-21 ENCOUNTER — Other Ambulatory Visit: Payer: Self-pay | Admitting: Family Medicine

## 2016-08-12 ENCOUNTER — Other Ambulatory Visit: Payer: Self-pay | Admitting: Family Medicine

## 2016-08-12 MED ORDER — FLUCONAZOLE 150 MG PO TABS
150.0000 mg | ORAL_TABLET | Freq: Once | ORAL | 0 refills | Status: AC
Start: 2016-08-12 — End: 2016-08-12

## 2016-08-12 MED FILL — FLUCONAZOLE 150 MG TABLET: 150 | 3 days supply | Qty: 2 | Fill #0

## 2016-08-12 NOTE — Progress Notes (Signed)
Patient reporting vaginal pruritis and white discharge since completing abx therapy.  Currently on Celexa.  Has tolerated Diflucan in the past without difficulty per patient.  Risk of QT prolongation while taking Celexa and Diflucan discussed with patient.  Meds ordered this encounter  Medications  . fluconazole (DIFLUCAN) 150 MG tablet    Sig: Take 1 tablet (150 mg total) by mouth once. May repeat dose in 3 days if no improvement in symptoms.    Dispense:  2 tablet    Refill:  0   Tammy White M. Lajuana Ripple, DO

## 2016-12-06 DIAGNOSIS — M25579 Pain in unspecified ankle and joints of unspecified foot: Secondary | ICD-10-CM | POA: Insufficient documentation

## 2016-12-07 ENCOUNTER — Ambulatory Visit
Admission: RE | Admit: 2016-12-07 | Discharge: 2016-12-07 | Disposition: A | Discharge status: Home / Self Care | Payer: BLUE CROSS/BLUE SHIELD | Source: Ambulatory Visit | Attending: Family Medicine | Admitting: Family Medicine

## 2016-12-07 DIAGNOSIS — M25572 Pain in left ankle and joints of left foot: Secondary | ICD-10-CM

## 2016-12-22 ENCOUNTER — Other Ambulatory Visit: Payer: Self-pay | Admitting: Family Medicine

## 2016-12-22 MED ORDER — PREDNISONE 20 MG PO TABS: ORAL_TABLET | ORAL | 0 refills | Status: DC

## 2016-12-22 MED ORDER — DOXYCYCLINE HYCLATE 100 MG PO CAPS: 100.0000 mg | ORAL_CAPSULE | Freq: Two times a day (BID) | ORAL | 0 refills | Status: DC

## 2016-12-22 NOTE — Progress Notes (Signed)
Sore throat Tight in chest Sinus congestion

## 2017-01-04 ENCOUNTER — Other Ambulatory Visit: Payer: Self-pay | Admitting: *Deleted

## 2017-01-04 MED ORDER — PANTOPRAZOLE SODIUM 40 MG PO TBEC: 40.0000 mg | DELAYED_RELEASE_TABLET | Freq: Every day | ORAL | 3 refills | Status: AC

## 2017-01-04 MED ORDER — FLUTICASONE FUROATE-VILANTEROL 200-25 MCG/INH IN AEPB: 1.0000 | INHALATION_SPRAY | Freq: Every day | RESPIRATORY_TRACT | 3 refills | Status: AC

## 2017-01-04 MED ORDER — CITALOPRAM HYDROBROMIDE 40 MG PO TABS: 40.0000 mg | ORAL_TABLET | Freq: Every day | ORAL | 3 refills | Status: AC

## 2017-01-04 MED ORDER — MONTELUKAST SODIUM 10 MG PO TABS: 10.0000 mg | ORAL_TABLET | Freq: Every day | ORAL | 3 refills | Status: AC

## 2017-01-04 MED ORDER — ESTRADIOL 1 MG PO TABS: 1.0000 mg | ORAL_TABLET | Freq: Every day | ORAL | 3 refills | Status: AC

## 2017-01-04 NOTE — Telephone Encounter (Signed)
Also Breo, Estradiol, Protonix with refills please

## 2017-01-19 ENCOUNTER — Encounter: Payer: Self-pay | Admitting: Family Medicine

## 2017-01-19 ENCOUNTER — Other Ambulatory Visit: Payer: Self-pay | Admitting: Family Medicine

## 2017-01-19 DIAGNOSIS — Z8742 Personal history of other diseases of the female genital tract: Secondary | ICD-10-CM

## 2017-01-19 DIAGNOSIS — E6609 Other obesity due to excess calories: Secondary | ICD-10-CM

## 2017-01-19 DIAGNOSIS — Z6835 Body mass index (BMI) 35.0-35.9, adult: Secondary | ICD-10-CM

## 2017-01-19 NOTE — Progress Notes (Signed)
Patient return to my practice after some absence. Has been really struggling with her weight. Would like to try Liraglutide he tied injection weekly for weight loss. In the past she's had some mild hyperglycemia. We discussed this at length. This is an off label use. We will do a trial of 4 weeks of liraglutide. I will see her with some lab work and see her back in clinic in 4 weeks. Dorcas Mcmurray

## 2017-01-20 LAB — BASIC METABOLIC PANEL WITH GFR
BUN: 10 mg/dL (ref 7–25)
CALCIUM: 9.3 mg/dL (ref 8.6–10.4)
CHLORIDE: 100 mmol/L (ref 98–110)
CO2: 28 mmol/L (ref 20–31)
CREATININE: 0.6 mg/dL (ref 0.50–1.05)
GFR, Est African American: >89 mL/min (ref >=60)
Glucose, Bld: 100 mg/dL — ABNORMAL HIGH (ref 65–99)
Potassium: 4.6 mmol/L (ref 3.5–5.3)
SODIUM: 136 mmol/L (ref 135–146)

## 2017-01-20 LAB — POCT GLYCOSYLATED HEMOGLOBIN (HGB A1C): HEMOGLOBIN A1C: 5.2

## 2017-01-27 ENCOUNTER — Other Ambulatory Visit: Payer: Self-pay | Admitting: Family Medicine

## 2017-01-27 DIAGNOSIS — L989 Disorder of the skin and subcutaneous tissue, unspecified: Secondary | ICD-10-CM

## 2017-01-27 MED ORDER — BETAMETHASONE DIPROPIONATE 0.05 % EX CREA: TOPICAL_CREAM | Freq: Two times a day (BID) | CUTANEOUS | 0 refills | Status: AC

## 2017-01-27 NOTE — Progress Notes (Signed)
Recurrent left arm lesion > 1 month. Lesion is located ventrally few inches above the elbow crease.

## 2017-01-27 NOTE — Addendum Note (Signed)
Addended by: Andrena Mews T on: 01/27/2017 04:25 PM   Modules accepted: Orders

## 2017-02-23 ENCOUNTER — Ambulatory Visit (INDEPENDENT_AMBULATORY_CARE_PROVIDER_SITE_OTHER): Payer: Managed Care, Other (non HMO) | Admitting: *Deleted

## 2017-02-23 DIAGNOSIS — R51 Headache: Secondary | ICD-10-CM | POA: Diagnosis not present

## 2017-02-23 DIAGNOSIS — R519 Headache, unspecified: Secondary | ICD-10-CM

## 2017-02-23 MED ORDER — KETOROLAC TROMETHAMINE 60 MG/2ML IM SOLN
60.0000 mg | Freq: Once | INTRAMUSCULAR | Status: AC
  Administered 2017-02-23: 60 mg via INTRAMUSCULAR

## 2017-02-23 MED ORDER — METHYLPREDNISOLONE ACETATE 40 MG/ML IJ SUSP
40.0000 mg | Freq: Once | INTRAMUSCULAR | Status: AC
  Administered 2017-02-23: 40 mg via INTRAMUSCULAR

## 2017-02-23 NOTE — Progress Notes (Signed)
   Patient in nurse clinic with complaints of headache.  Order per Dr. Nori Riis to give Toradol 60 mg/2 ML IM x 1and Depo-Medrol 40 mg/mL I x..  Toradol 60 mg given RUOQ and Depo-Medrol 40 mg given LOUQ.  Derl Barrow, RN

## 2017-03-09 ENCOUNTER — Ambulatory Visit (INDEPENDENT_AMBULATORY_CARE_PROVIDER_SITE_OTHER): Payer: Managed Care, Other (non HMO) | Admitting: Family Medicine

## 2017-03-09 ENCOUNTER — Encounter: Payer: Self-pay | Admitting: Family Medicine

## 2017-03-09 VITALS — BP 128/86 | HR 77 | Temp 97.7°F | Ht 61.0 in | Wt 204.2 lb

## 2017-03-09 DIAGNOSIS — Z8742 Personal history of other diseases of the female genital tract: Secondary | ICD-10-CM | POA: Insufficient documentation

## 2017-03-09 DIAGNOSIS — Z6838 Body mass index (BMI) 38.0-38.9, adult: Secondary | ICD-10-CM

## 2017-03-09 DIAGNOSIS — E6609 Other obesity due to excess calories: Secondary | ICD-10-CM

## 2017-03-09 MED ORDER — LIRAGLUTIDE -WEIGHT MANAGEMENT 18 MG/3ML ~~LOC~~ SOPN: 0.6000 mg | PEN_INJECTOR | Freq: Every day | SUBCUTANEOUS | 2 refills | Status: AC

## 2017-03-09 NOTE — Progress Notes (Signed)
    CHIEF COMPLAINT / HPI:   #1. Recovering from laryngitis. Still has some bilateral ear pain and occasional popping. Has been using her Flonase once daily. Voice is back to normal. No cough. As was back to baseline #2. Obesity: Really wants to try liraglutide She read about it from the handout I gave her including all the potential risks. She has had problems with her weight lifelong. Is currently working out 4-5 days a week in our to time. Usually does a lot of walking during this exercise. In some exercise machines. She is maintained her current weight but she's not been able to lose anything in the last year and a half.  REVIEW OF SYSTEMS:  No fever, no chills. Weight is stable. No chest pains or shortness of breath. No muscle aches. No joint pains. Energy is baseline. No problems with sleep.  OBJECTIVE:  Vital signs are reviewed.   Vital signs reviewed GENERALl: Well developed, well nourished, in no acute distress. HEENT: PERRLA, EOMI, sclerae are nonicteric NECK: Supple, FROM, without lymphadenopathy. TMs are slightly retracted but they're still a very nice [facility] light and they're still mobile. THYROID: normal without nodularity CAROTID ARTERIES: without bruits LUNGS: clear to auscultation bilaterally. No wheezes or rales. Normal respiratory effort HEART: Regular rate and rhythm, no murmurs. Distal pulses are bilaterally symmetrical, 2+. ABDOMEN: soft with positive bowel sounds. No masses noted MSK: MOE x 4. Normal muscle strength, bulk and tone. SKIN no rash. Normal temperature. NEURO: no focal deficits. Normal gait. Normal balance.   ASSESSMENT / PLAN: Please see problem oriented charting for details  Additionally, #2 area mild eustachian tube dysfunction probably resolving from her recent viral laryngitis. I recommend she increase her Flonase dosing for the next 1-2 weeks she's cautioned that this may cause some dryness of the nose. She may want to go to 2 sprays every other  day alternating with one spray each nostril. This should resolve on its own.

## 2017-03-09 NOTE — Assessment & Plan Note (Signed)
Long discussion. Continue diet As. She's been pretty faithful with the exercise regimen and has maintained her current weight. We'll start Lyrica would time. Follow-up 4-6 weeks. Lab results were reviewed.

## 2017-03-14 ENCOUNTER — Telehealth: Payer: Self-pay | Admitting: *Deleted

## 2017-03-14 NOTE — Telephone Encounter (Signed)
Prior Authorization received from [employer] for Killian 18 mg/30ml. PA is completed online at www.[email or web address]. PA is pending per [employer].  Derl Barrow, RN

## 2017-03-16 NOTE — Telephone Encounter (Signed)
PA for Victoza was denied via [employer].  Patient must have tried and failed Bydureon, Bydureon BCISE, Bydureon Pen, Byetta, Trulicity.  Denial placed in scan box to be placed in patient's record.  Derl Barrow, RN

## 2022-06-01 ENCOUNTER — Encounter: Payer: Self-pay | Admitting: *Deleted
